# Patient Record
Sex: Male | Born: 1985 | Race: Black or African American | Hispanic: No | Marital: Single | Smoking: Current every day smoker
Health system: Southern US, Community
[De-identification: ages and names within clinical notes are randomized; demographics above are authoritative.]

## PROBLEM LIST (undated history)

## (undated) DIAGNOSIS — I10 Essential (primary) hypertension: Secondary | ICD-10-CM

## (undated) DIAGNOSIS — F319 Bipolar disorder, unspecified: Secondary | ICD-10-CM

## (undated) HISTORY — PX: ABDOMINAL SURGERY: SHX537

## (undated) HISTORY — PX: EYE SURGERY: SHX253

## (undated) HISTORY — PX: APPENDECTOMY: SHX54

---

## 2014-10-06 ENCOUNTER — Emergency Department (HOSPITAL_COMMUNITY)
Admission: EM | Admit: 2014-10-06 | Discharge: 2014-10-06 | Disposition: A | Discharge status: Home / Self Care | Payer: Self-pay | Attending: Emergency Medicine | Admitting: Emergency Medicine

## 2014-10-06 ENCOUNTER — Encounter (HOSPITAL_COMMUNITY): Payer: Self-pay | Admitting: Emergency Medicine

## 2014-10-06 DIAGNOSIS — F32A Depression, unspecified: Secondary | ICD-10-CM

## 2014-10-06 DIAGNOSIS — R45851 Suicidal ideations: Secondary | ICD-10-CM

## 2014-10-06 DIAGNOSIS — I10 Essential (primary) hypertension: Secondary | ICD-10-CM | POA: Insufficient documentation

## 2014-10-06 DIAGNOSIS — Z720 Tobacco use: Secondary | ICD-10-CM | POA: Insufficient documentation

## 2014-10-06 DIAGNOSIS — F1210 Cannabis abuse, uncomplicated: Secondary | ICD-10-CM | POA: Insufficient documentation

## 2014-10-06 DIAGNOSIS — F332 Major depressive disorder, recurrent severe without psychotic features: Secondary | ICD-10-CM

## 2014-10-06 DIAGNOSIS — F329 Major depressive disorder, single episode, unspecified: Secondary | ICD-10-CM | POA: Insufficient documentation

## 2014-10-06 HISTORY — DX: Essential (primary) hypertension: I10

## 2014-10-06 HISTORY — DX: Bipolar disorder, unspecified: F31.9

## 2014-10-06 LAB — SALICYLATE LEVEL

## 2014-10-06 LAB — COMPREHENSIVE METABOLIC PANEL
ALT: 29 U/L (ref 17–63)
AST: 24 U/L (ref 15–41)
Albumin: 4.2 g/dL (ref 3.5–5.0)
Alkaline Phosphatase: 65 U/L (ref 38–126)
Anion gap: 7 (ref 5–15)
BUN: 8 mg/dL (ref 6–20)
CO2: 27 mmol/L (ref 22–32)
Calcium: 9.1 mg/dL (ref 8.9–10.3)
Chloride: 106 mmol/L (ref 101–111)
Creatinine, Ser: 1.15 mg/dL (ref 0.61–1.24)
GFR calc Af Amer: >60 mL/min (ref >60)
GFR calc non Af Amer: >60 mL/min (ref >60)
Glucose, Bld: 89 mg/dL (ref 65–99)
Potassium: 3.7 mmol/L (ref 3.5–5.1)
Sodium: 140 mmol/L (ref 135–145)
Total Bilirubin: 1 mg/dL (ref 0.3–1.2)
Total Protein: 7.4 g/dL (ref 6.5–8.1)

## 2014-10-06 LAB — CBC
HCT: 41.9 % (ref 39.0–52.0)
Hemoglobin: 13.6 g/dL (ref 13.0–17.0)
MCH: 29.8 pg (ref 26.0–34.0)
MCHC: 32.5 g/dL (ref 30.0–36.0)
MCV: 91.9 fL (ref 78.0–100.0)
Platelets: 244 10*3/uL (ref 150–400)
RBC: 4.56 MIL/uL (ref 4.22–5.81)
RDW: 12.9 % (ref 11.5–15.5)
WBC: 6.8 10*3/uL (ref 4.0–10.5)

## 2014-10-06 LAB — ACETAMINOPHEN LEVEL

## 2014-10-06 LAB — RAPID URINE DRUG SCREEN, HOSP PERFORMED
AMPHETAMINES: NOT DETECTED
BARBITURATES: NOT DETECTED
Benzodiazepines: NOT DETECTED
Cocaine: NOT DETECTED
Opiates: NOT DETECTED
Tetrahydrocannabinol: POSITIVE — AB

## 2014-10-06 LAB — ETHANOL: Alcohol, Ethyl (B): <5 mg/dL (ref <5)

## 2014-10-06 MED ORDER — LORAZEPAM 1 MG PO TABS
1.0000 mg | ORAL_TABLET | Freq: Three times a day (TID) | ORAL | Status: DC | PRN
  Filled 2014-10-06: qty 1

## 2014-10-06 MED ORDER — IBUPROFEN 200 MG PO TABS: 600.0000 mg | ORAL_TABLET | Freq: Three times a day (TID) | ORAL | Status: DC | PRN

## 2014-10-06 MED ORDER — NICOTINE 21 MG/24HR TD PT24
21.0000 mg | MEDICATED_PATCH | Freq: Every day | TRANSDERMAL | Status: DC
  Administered 2014-10-06: 21 mg via TRANSDERMAL
  Filled 2014-10-06: qty 1

## 2014-10-06 NOTE — BHH Suicide Risk Assessment (Cosign Needed)
Suicide Risk Assessment  Discharge Assessment   [facility] Discharge Suicide Risk Assessment   Demographic Factors:  Male, Low socioeconomic status and Living alone  Total Time spent with patient: 20 minutes  Musculoskeletal: Strength & Muscle Tone: within normal limits Gait & Station: normal Patient leans: N/A  Psychiatric Specialty Exam:     Blood pressure 124/65, pulse 79, temperature 97.6 F (36.4 C), temperature source Oral, resp. rate 18, SpO2 100 %.There is no height or weight on file to calculate BMI.  General Appearance: Casual and Fairly Groomed  Patent attorney::  Good  Speech:  Clear and Coherent and Normal Rate  Volume:  Normal  Mood:  Anxious  Affect:  Congruent  Thought Process:  Coherent, Goal Directed and Intact  Orientation:  Full (Time, Place, and Person)  Thought Content:  WDL  Suicidal Thoughts:  No  Homicidal Thoughts:  No  Memory:  Immediate;   Good Recent;   Good Remote;   Good  Judgement:  Fair  Insight:  Fair  Psychomotor Activity:  Normal  Concentration:  Fair  Recall:  NA  Fund of Knowledge:Fair  Language: Good  Akathisia:  NA  Handed:  Right  AIMS (if indicated):     Assets:  Desire for Improvement  ADL's:  Intact  Cognition: WNL  Sleep:           Has this patient used any form of tobacco in the last 30 days? (Cigarettes, Smokeless Tobacco, Cigars, and/or Pipes) Yes, A prescription for an FDA-approved tobacco cessation medication was offered at discharge and the patient refused  Mental Status Per Nursing Assessment::   On Admission:     Current Mental Status by Physician: NA  Loss Factors: NA  Historical Factors: NA  Risk Reduction Factors:   Positive coping skills or problem solving skills  Continued Clinical Symptoms:  Bipolar Disorder:   Depressive phase Depression:   Insomnia  Cognitive Features That Contribute To Risk:  Polarized thinking    Suicide Risk:  Minimal: No identifiable suicidal ideation.  Patients  presenting with no risk factors but with morbid ruminations; may be classified as minimal risk based on the severity of the depressive symptoms  Principal Problem: Major depressive disorder, recurrent severe without psychotic features Discharge Diagnoses:  Patient Active Problem List   Diagnosis Date Noted  . Major depressive disorder, recurrent severe without psychotic features [F33.2] 10/06/2014    Priority: High  . Depressed [F32.9]       Plan Of Care/Follow-up recommendations:  Activity:  as tolerated Diet:  regular  Is patient on multiple antipsychotic therapies at discharge:  No   Has Patient had three or more failed trials of antipsychotic monotherapy by history:  No  Recommended Plan for Multiple Antipsychotic Therapies: NA    Boubacar Lerette C    PMHNP-BC 10/06/2014, 11:09 AM

## 2014-10-06 NOTE — ED Notes (Signed)
Pt brought in by EMS for suicidal thoughts  Pt has ETOH on board  Pt has hx of bipolar and depression  Pt has not been taking his medications
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
## 2014-10-06 NOTE — ED Notes (Signed)
Patient to be discharged with outpatient referrals per Dr. Jannifer Franklin and Julieanne Cotton, NP. Prior to Scientist, physiological provided patient with a list of referrals including [facility], [address], [facility] [place], [facility], etc.

## 2014-10-06 NOTE — BHH Counselor (Signed)
This Clinical research associate faxed supporting documentation to the following facilities for patient placement; [facility] [address]  [place], [place] OBS Counselor
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
## 2014-10-06 NOTE — ED Notes (Signed)
Pt's has in belonging bag:  White shirt, white baseball cap, black charger, white tennis shoes, dark blue jean short, black smart phone

## 2014-10-06 NOTE — BH Assessment (Addendum)
Tele Assessment Note   Caleb Griffin is an 29 y.o. male. Who recently moved to [place] and is staying in a hotel, presenting to ED with worsening depression and anxiety. Pt reports he has had SI for three days, thoughts to harm himself with no specific plan. Pt is unable to contract for safety. He sts he came to ED because he is afraid he will harm himself and does not want to do this as he has 4 dtrs. He also reports he has had chest pain that radiates down his right leg and this is making him very anxious. At the time of assessment pt is alert and oriented times 4 with depressed and anxious mood, appropriate affect. Denies HI, AVH, and self harm. Pt has attempted suicide in the past by stabbing himself in the stomach. He reports he had SI six months ago and was hospitalized in [place] but did not act on thoughts.   Pt reports hx of depression for year and sx have been getting worse. He was prescribed Zoloft, but only took it for a month and then stopped. He reports he thinks he as dx with bipolar but denies sx of mania or hypomania. Pt endorses sadness, isolating, loss of pleasure, trouble sleeping, weight loss and irritability. SI without planning.   Pt reports has been very anxious the past couple of days, due to chest pain, and nightmares that something is going to happen to his family. He reports he now worries constantly about how his family is doing. Pt may be experiencing panic attacks but is unsure.   Pt reports using etoh and THC daily. Pt reports he has been inpt "a lot" in [place]. Reports he has no current providers. Family hx negative for MH, SA, and SI concerns per pt.   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 Bipolar 1 disorder   . Hypertension     Past Surgical History   Procedure Laterality Date  . Appendectomy    . Eye surgery    . Abdominal surgery      Family History: History reviewed. No pertinent family history.  Social History:  reports that he has been smoking Cigarettes.  He does not have any smokeless tobacco history on file. He reports that he drinks alcohol. He reports that he uses illicit drugs (Marijuana).  Additional Social History:  Alcohol / Drug Use Pain Medications: See PTA Prescriptions: See PTA, reports he has been off his medications for several months  Over the Counter: See PTA History of alcohol / drug use?: Yes Longest period of sobriety (when/how long): 2-3 years, no hx of seizures reported  Negative Consequences of Use:  (denies) Withdrawal Symptoms:  (denies) Substance #1 Name of Substance 1: etoh  1 - Age of First Use: [age] 1 - Amount (size/oz): 2-3 beers 1 - Frequency: daily  1 - Duration: 2 weeks daily, prior to that about every other day  1 - Last Use / Amount: yesterday  Substance #2 Name of Substance 2: THC 2 - Age of First Use: 14-15 2 - Amount (size/oz): 1-2 blunts 2 - Frequency: every day  2 - Duration: years 2 - Last Use / Amount: a day or so  CIWA: CIWA-Ar BP: 112/78 mmHg Pulse Rate: 77 COWS:    PATIENT STRENGTHS: (choose at  least two) Average or above average intelligence Communication skills  Allergies: No Known Allergies  Home Medications:  (Not in a hospital admission)  OB/GYN Status:  No LMP for male patient.  General Assessment Data [facility] of Assessment: [facility] TTS Assessment: In system Is this a Tele or Face-to-Face Assessment?: Face-to-Face Is this an Initial Assessment or a Re-assessment for this encounter?: Initial Assessment Marital status: Single Is patient pregnant?: No Pregnancy Status: No Living Arrangements: Alone (in hotel, in [place] only a short time) Can pt return to current living arrangement?: Yes Admission Status: Voluntary Is patient capable of signing voluntary  admission?: Yes Referral Source: Self/Family/Friend Insurance type: none     Crisis Care Plan Living Arrangements: Alone (in hotel, in [place] only a short time) Name of Psychiatrist: none Name of Therapist: none  Education Status Is patient currently in school?: No Current Grade: NA Highest grade of school patient has completed: GED Name of school: NA Contact person: NA  Risk to self with the past 6 months Suicidal Ideation: Yes-Currently Present Has patient been a risk to self within the past 6 months prior to admission? : Yes Suicidal Intent: No-Not Currently/Within Last 6 Months Has patient had any suicidal intent within the past 6 months prior to admission? : Yes Is patient at risk for suicide?: Yes Has patient had any suicidal plan within the past 6 months prior to admission? : Yes Access to Means: Yes Specify Access to Suicidal Means: knife What has been your use of drugs/alcohol within the last 12 months?: Pt reports using etoh and THC near daily since teens Previous Attempts/Gestures: Yes How many times?: 1 (7 years ago stabbed himself, SI leading to inpt 6 months ago) Other Self Harm Risks: none Triggers for Past Attempts: Unpredictable Intentional Self Injurious Behavior: None Family Suicide History: No Recent stressful life event(s): Other (Comment) (moved to [place], chest pain ) Persecutory voices/beliefs?: No Depression: Yes Depression Symptoms: Despondent, Insomnia, Tearfulness, Isolating, Loss of interest in usual pleasures, Feeling worthless/self pity, Feeling angry/irritable Substance abuse history and/or treatment for substance abuse?: Yes Suicide prevention information given to non-admitted patients: Not applicable  Risk to Others within the past 6 months Homicidal Ideation: No Does patient have any lifetime risk of violence toward others beyond the six months prior to admission? : No Thoughts of Harm to Others: No Current Homicidal Intent: No Current  Homicidal Plan: No Access to Homicidal Means: No Identified Victim: none History of harm to others?: No Assessment of Violence: None Noted Violent Behavior Description: none Does patient have access to weapons?: No Criminal Charges Pending?: No Does patient have a court date: No Is patient on probation?: No  Psychosis Hallucinations: None noted Delusions: None noted  Mental Status Report Appearance/Hygiene: Unremarkable Eye Contact: Good Motor Activity: Unremarkable Speech: Logical/coherent Level of Consciousness: Alert Mood: Depressed, Anxious Affect: Appropriate to circumstance Anxiety Level: Severe Thought Processes: Coherent, Relevant Judgement: Partial Orientation: Person, Place, Time, Situation Obsessive Compulsive Thoughts/Behaviors: None  Cognitive Functioning Concentration: Normal Memory: Recent Intact, Remote Intact IQ: Average Insight: Fair Impulse Control: Fair Appetite: Poor Weight Loss:  ("At least a couple") Weight Gain: 0 Sleep: Decreased Total Hours of Sleep:  (reports trouble sleeping lately, nightmares) Vegetative Symptoms: Decreased grooming  ADLScreening [facility] Assessment Services) Patient's cognitive ability adequate to safely complete daily activities?: Yes Patient able to express need for assistance with ADLs?: Yes Independently performs ADLs?: Yes (appropriate for developmental age)  Prior Inpatient Therapy Prior Inpatient Therapy: Yes Prior Therapy Dates: "A lot" Prior Therapy  Facilty/Provider(s): in [place] Reason for Treatment: depression, SI   Prior Outpatient Therapy Prior Outpatient Therapy: Yes Prior Therapy Dates:  in the past in [place] Prior Therapy Facilty/Provider(s): unknown Reason for Treatment: depression  Does patient have an ACCT team?: No Does patient have Intensive In-House Services?  : No Does patient have [facility] services? : No Does patient have [facility] services?: No  ADL Screening (condition at time of  admission) Patient's cognitive ability adequate to safely complete daily activities?: Yes Is the patient deaf or have difficulty hearing?: No Does the patient have difficulty seeing, even when wearing glasses/contacts?: No Does the patient have difficulty concentrating, remembering, or making decisions?: No Patient able to express need for assistance with ADLs?: Yes Does the patient have difficulty dressing or bathing?: No Independently performs ADLs?: Yes (appropriate for developmental age) Does the patient have difficulty walking or climbing stairs?: No Weakness of Legs: None Weakness of Arms/Hands: None  Home Assistive Devices/Equipment Home Assistive Devices/Equipment: None    Abuse/Neglect Assessment (Assessment to be complete while patient is alone) Physical Abuse: Denies Verbal Abuse: Denies Sexual Abuse: Denies Exploitation of patient/patient's resources: Denies Values / Beliefs Cultural Requests During Hospitalization: None Spiritual Requests During Hospitalization: None   Advance Directives (For Healthcare) Does patient have an advance directive?: No Would patient like information on creating an advanced directive?: No - patient declined information Nutrition Screen- [facility] Patient's home diet: Regular Has the patient recently lost weight without trying?: Yes, 2-13 lbs. Has the patient been eating poorly because of a decreased appetite?: Yes Malnutrition Screening Tool Score: 2  Additional Information 1:1 In Past 12 Months?: No CIRT Risk: No Elopement Risk: No Does patient have medical clearance?: No     Disposition:  Per Donell Sievert, PA meets inpt criteria for 400 or 300 hall. No current [facility] beds available. TTS to seek placement.  Informed Pt, RN, and Dr. Read Drivers.   Clista Bernhardt, [facility] Triage Specialist 10/06/2014 5:37 AM  Disposition Initial Assessment Completed for this Encounter: Yes  Cerita Rabelo M 10/06/2014 5:18 AM

## 2014-10-06 NOTE — ED Notes (Signed)
Pt appears anxious and depressed. When asked about suicidal thoughts, pt responded "I am feeling decent." Pt did not want to talk bout why he came to the hospital and stated that he was tired of telling his story. Pt c/o mid chest pain and says that the pain happens sometimes when he smokes. Offered ativan and pt refused. Will recheck vitals. Safety maintained on the unit.

## 2014-10-20 ENCOUNTER — Emergency Department (HOSPITAL_COMMUNITY)
Admission: EM | Admit: 2014-10-20 | Discharge: 2014-10-21 | Disposition: A | Discharge status: Home / Self Care | Payer: Self-pay | Attending: Emergency Medicine | Admitting: Emergency Medicine

## 2014-10-20 DIAGNOSIS — R0789 Other chest pain: Secondary | ICD-10-CM | POA: Insufficient documentation

## 2014-10-20 DIAGNOSIS — Z8659 Personal history of other mental and behavioral disorders: Secondary | ICD-10-CM | POA: Insufficient documentation

## 2014-10-20 DIAGNOSIS — I10 Essential (primary) hypertension: Secondary | ICD-10-CM | POA: Insufficient documentation

## 2014-10-20 DIAGNOSIS — R630 Anorexia: Secondary | ICD-10-CM | POA: Insufficient documentation

## 2014-10-20 DIAGNOSIS — Z720 Tobacco use: Secondary | ICD-10-CM | POA: Insufficient documentation

## 2014-10-20 DIAGNOSIS — Z79899 Other long term (current) drug therapy: Secondary | ICD-10-CM | POA: Insufficient documentation

## 2014-10-21 ENCOUNTER — Emergency Department (HOSPITAL_COMMUNITY): Payer: Self-pay

## 2014-10-21 ENCOUNTER — Encounter (HOSPITAL_COMMUNITY): Payer: Self-pay | Admitting: Emergency Medicine

## 2014-10-21 LAB — I-STAT TROPONIN, ED
Troponin i, poc: 0 ng/mL (ref 0.00–0.08)
Troponin i, poc: 0 ng/mL (ref 0.00–0.08)

## 2014-10-21 LAB — BASIC METABOLIC PANEL
ANION GAP: 7 (ref 5–15)
BUN: 14 mg/dL (ref 6–20)
CALCIUM: 9.1 mg/dL (ref 8.9–10.3)
CO2: 26 mmol/L (ref 22–32)
Chloride: 106 mmol/L (ref 101–111)
Creatinine, Ser: 1.26 mg/dL — ABNORMAL HIGH (ref 0.61–1.24)
GFR calc Af Amer: >60 mL/min (ref >60)
Glucose, Bld: 134 mg/dL — ABNORMAL HIGH (ref 65–99)
Potassium: 3.8 mmol/L (ref 3.5–5.1)
Sodium: 139 mmol/L (ref 135–145)

## 2014-10-21 LAB — CBC
HCT: 41 % (ref 39.0–52.0)
Hemoglobin: 13.4 g/dL (ref 13.0–17.0)
MCH: 30.2 pg (ref 26.0–34.0)
MCHC: 32.7 g/dL (ref 30.0–36.0)
MCV: 92.3 fL (ref 78.0–100.0)
Platelets: 255 10*3/uL (ref 150–400)
RBC: 4.44 MIL/uL (ref 4.22–5.81)
RDW: 13.1 % (ref 11.5–15.5)
WBC: 8.4 10*3/uL (ref 4.0–10.5)

## 2014-10-21 LAB — CK: Total CK: 389 U/L (ref 49–397)

## 2014-10-21 LAB — D-DIMER, QUANTITATIVE (NOT AT ARMC)

## 2014-10-21 MED ORDER — CYCLOBENZAPRINE HCL 10 MG PO TABS
5.0000 mg | ORAL_TABLET | Freq: Once | ORAL | Status: AC
  Administered 2014-10-21: 5 mg via ORAL
  Filled 2014-10-21: qty 1

## 2014-10-21 MED ORDER — SODIUM CHLORIDE 0.9 % IV BOLUS (SEPSIS)
1000.0000 mL | Freq: Once | INTRAVENOUS | Status: AC
  Administered 2014-10-21: 1000 mL via INTRAVENOUS

## 2014-10-21 MED ORDER — NAPROXEN 500 MG PO TABS: ORAL_TABLET | ORAL | Status: DC

## 2014-10-21 MED ORDER — METHOCARBAMOL 500 MG PO TABS: ORAL_TABLET | ORAL | Status: DC

## 2014-10-21 MED ORDER — KETOROLAC TROMETHAMINE 30 MG/ML IJ SOLN
30.0000 mg | Freq: Once | INTRAMUSCULAR | Status: AC
  Administered 2014-10-21: 30 mg via INTRAVENOUS
  Filled 2014-10-21: qty 1

## 2014-10-21 NOTE — ED Notes (Signed)
Pt. Made aware for the need of urine. 

## 2014-10-21 NOTE — ED Notes (Signed)
Pt states he started having chest pain about an hour ago  Pt states the pain is in the left chest and is sharp in nature  Pt states he feels dizzy and short of breath  Pt states he had an episode about 2 weeks ago that was the same

## 2014-10-21 NOTE — ED Provider Notes (Signed)
CSN: [id number]   Arrival date & time 10/20/14 2354  History  This chart was scribed for Devoria Albe, MD by Bethel Born, ED Scribe. This patient was seen in room WA16/WA16 and the patient's care was started at 12:58 AM.  Chief Complaint  Patient presents with  . Chest Pain    HPI The history is provided by the patient. No language interpreter was used.   Caleb Griffin is a 29 y.o. male who presents to the Emergency Department complaining of constant left upper chest pain with sudden onset 2 hours ago while smoking a cigarette after work. Initially he had a sharp pain that radiated to the LLE and took his breath. His current pain is dull and rated 8/10 in severity. Movement of the arm exacerbates the pain. He reports several episodes of sharp pain earlier during the day. Recently he has had a poor appetite but notes drinking water. Pt denies fever, cough, and current SOB. He works in [employer] but denies new activity or any known injury. Smokes 2 PPD. Occasional alcohol use. Marijuana+ (last use today). Reports taking his psych meds and is followed at [facility]. He has no PCP and his antihypertensives were prescribed by [facility].   PCP none [facility]  Past Medical History  Diagnosis Date  . Bipolar 1 disorder   . Hypertension     Past Surgical History  Procedure Laterality Date  . Appendectomy    . Eye surgery    . Abdominal surgery      History reviewed. No pertinent family history.  Social History  Substance Use Topics  . Smoking status: Current Every Day Smoker    Types: Cigarettes  . Smokeless tobacco: None  . Alcohol Use: Yes   employed  Review of Systems  Constitutional: Positive for appetite change.  Respiratory: Negative for shortness of breath.   Cardiovascular: Positive for chest pain.  All other systems reviewed and are negative.   Home Medications   Prior to Admission medications   Medication Sig Start Date End Date Taking? Authorizing Provider   amLODipine (NORVASC) 5 MG tablet Take 5 mg by mouth daily.   Yes Historical Provider, MD  hydrochlorothiazide (HYDRODIURIL) 25 MG tablet Take 25 mg by mouth daily.   Yes Historical Provider, MD  methocarbamol (ROBAXIN) 500 MG tablet Take 1 or 2 po Q 6hrs for pain 10/21/14   Devoria Albe, MD  naproxen (NAPROSYN) 500 MG tablet Take 1 po BID with food prn pain 10/21/14   Devoria Albe, MD  bipolar medication  Allergies  Review of patient's allergies indicates no known allergies.  Triage Vitals: BP 110/65 mmHg  Pulse 85  Temp(Src) 98.2 F (36.8 C) (Oral)  Resp 17  SpO2 99%  Vital signs normal    Physical Exam  Constitutional: He is oriented to person, place, and time. He appears well-developed and well-nourished.  Non-toxic appearance. He does not appear ill. No distress.  HENT:  Head: Normocephalic and atraumatic.  Right Ear: External ear normal.  Left Ear: External ear normal.  Nose: Nose normal. No mucosal edema or rhinorrhea.  Mouth/Throat: Oropharynx is clear and moist and mucous membranes are normal. No dental abscesses or uvula swelling.  Eyes: Conjunctivae and EOM are normal. Pupils are equal, round, and reactive to light.  Neck: Normal range of motion and full passive range of motion without pain. Neck supple.  Cardiovascular: Normal rate, regular rhythm and normal heart sounds.  Exam reveals no gallop and no friction rub.   No murmur  heard. Pulmonary/Chest: Effort normal and breath sounds normal. No respiratory distress. He has no wheezes. He has no rhonchi. He has no rales. He exhibits tenderness. He exhibits no crepitus.    Palpation of the left upper chest wall reproduces his pain  Abdominal: Soft. Normal appearance and bowel sounds are normal. He exhibits no distension. There is no tenderness. There is no rebound and no guarding.  Musculoskeletal: Normal range of motion. He exhibits no edema or tenderness.  Moves all extremities well.   Neurological: He is alert and oriented  to person, place, and time. He has normal strength. No cranial nerve deficit.  Skin: Skin is warm, dry and intact. No rash noted. No erythema. No pallor.  Psychiatric: He has a normal mood and affect. His speech is normal and behavior is normal. His mood appears not anxious.  Nursing note and vitals reviewed.   ED Course  Procedures Discharge Medication List as of 10/21/2014  5:29 AM    START taking these medications   Details  methocarbamol (ROBAXIN) 500 MG tablet Take 1 or 2 po Q 6hrs for pain, Print    naproxen (NAPROSYN) 500 MG tablet Take 1 po BID with food prn pain, Print         DIAGNOSTIC STUDIES: Oxygen Saturation is 99% on RA, normal by my interpretation.    COORDINATION OF CARE: 1:05 AM Discussed treatment plan which includes CXR, EKG, labs with pt at bedside and pt agreed to plan.  Patient was rechecked at 3:15 AM. He is sleeping soundly and snoring. He is difficult to awaken. Immediately when he wakes up he states he still hurting and his pain is a "7". A second troponin and d-dimer was added to his blood work.  At time of discharge she is d-dimer is normal so concern for PE is extremely low. His chest was extremely sore to palpation and I still feel like he is having chest wall pain. Patient was discharged with anti-inflammatory and muscle relaxers.  Results for orders placed or performed during the hospital encounter of 10/20/14  Basic metabolic panel  Result Value Ref Range   Sodium 139 135 - 145 mmol/L   Potassium 3.8 3.5 - 5.1 mmol/L   Chloride 106 101 - 111 mmol/L   CO2 26 22 - 32 mmol/L   Glucose, Bld 134 (H) 65 - 99 mg/dL   BUN 14 6 - 20 mg/dL   Creatinine, Ser 1.61 (H) 0.61 - 1.24 mg/dL   Calcium 9.1 8.9 - 09.6 mg/dL   GFR calc non Af Amer >60 >60 mL/min   GFR calc Af Amer >60 >60 mL/min   Anion gap 7 5 - 15  CBC  Result Value Ref Range   WBC 8.4 4.0 - 10.5 K/uL   RBC 4.44 4.22 - 5.81 MIL/uL   Hemoglobin 13.4 13.0 - 17.0 g/dL   HCT 04.5 40.9 -  81.1 %   MCV 92.3 78.0 - 100.0 fL   MCH 30.2 26.0 - 34.0 pg   MCHC 32.7 30.0 - 36.0 g/dL   RDW 91.4 78.2 - 95.6 %   Platelets 255 150 - 400 K/uL  CK  Result Value Ref Range   Total CK 389 49 - 397 U/L  D-dimer, quantitative (not at [facility])  Result Value Ref Range   D-Dimer, Quant <0.27 0.00 - 0.48 ug/mL-FEU  I-stat troponin, ED  Result Value Ref Range   Troponin i, poc 0.00 0.00 - 0.08 ng/mL   Comment 3  I-stat troponin, ED  Result Value Ref Range   Troponin i, poc 0.00 0.00 - 0.08 ng/mL   Comment 3           Laboratory interpretation all normal including delta troponins and normal d-dimer   I, Devoria Albe, MD, personally reviewed and evaluated these images and lab results as part of my medical decision-making.  Imaging Review   Dg Chest 2 View  10/21/2014   CLINICAL DATA:  Upper chest pain for 24 hr, increasing all day. Shortness of breath. Smoker and high blood pressure.  EXAM: CHEST  2 VIEW  COMPARISON:  None.  FINDINGS: The heart size and mediastinal contours are within normal limits. Both lungs are clear. The visualized skeletal structures are unremarkable.  IMPRESSION: No active cardiopulmonary disease.   Electronically Signed   By: Burman Nieves M.D.   On: 10/21/2014 00:48    EKG Interpretation  Date/Time:  Wednesday October 20 2014 23:59:00 EDT Ventricular Rate:  88 PR Interval:  128 QRS Duration: 92 QT Interval:  338 QTC Calculation: 409 R Axis:   34 Text Interpretation:  Sinus rhythm Nonspecific T abnormalities, diffuse leads No old tracing to compare Confirmed by Sabiha Sura  MD-I, Burt Piatek ([id number]) on 10/21/2014 12:07:21 AM       MDM   Final diagnoses:  Chest wall pain    Discharge Medication List as of 10/21/2014  5:29 AM    START taking these medications   Details  methocarbamol (ROBAXIN) 500 MG tablet Take 1 or 2 po Q 6hrs for pain, Print    naproxen (NAPROSYN) 500 MG tablet Take 1 po BID with food prn pain, Print        Plan discharge  Devoria Albe, MD, FACEP    I personally performed the services described in this documentation, which was scribed in my presence. The recorded information has been reviewed and considered.  Devoria Albe, MD, Concha Pyo, MD 10/21/14 [phone]

## 2014-10-21 NOTE — Discharge Instructions (Signed)
Your tests tonight were all normal. You are having chest wall pain. Take the medication as prescribed.  Chest Wall Pain Chest wall pain is pain felt in or around the chest bones and muscles. It may take up to 6 weeks to get better. It may take longer if you are active. Chest wall pain can happen on its own. Other times, things like germs, injury, coughing, or exercise can cause the pain. HOME CARE   Avoid activities that make you tired or cause pain. Try not to use your chest, belly (abdominal), or side muscles. Do not use heavy weights.  Put ice on the sore area.  Put ice in a plastic bag.  Place a towel between your skin and the bag.  Leave the ice on for 15-20 minutes for the first 2 days.  Only take medicine as told by your doctor. GET HELP RIGHT AWAY IF:   You have more pain or are very uncomfortable.  You have a fever.  Your chest pain gets worse.  You have new problems.  You feel sick to your stomach (nauseous) or throw up (vomit).  You start to sweat or feel lightheaded.  You have a cough with mucus (phlegm).  You cough up blood. MAKE SURE YOU:   Understand these instructions.  Will watch your condition.  Will get help right away if you are not doing well or get worse. Document Released: 08/01/2007 Document Revised: 05/07/2011 Document Reviewed: 10/09/2010 [facility] Patient Information 2015 [place], [place]. This information is not intended to replace advice given to you by your health care provider. Make sure you discuss any questions you have with your health care provider.

## 2014-10-22 ENCOUNTER — Emergency Department (HOSPITAL_COMMUNITY)
Admission: EM | Admit: 2014-10-22 | Discharge: 2014-10-22 | Discharge status: Against Medical Advice | Payer: Self-pay | Attending: Emergency Medicine | Admitting: Emergency Medicine

## 2014-10-22 ENCOUNTER — Encounter (HOSPITAL_COMMUNITY): Payer: Self-pay | Admitting: Emergency Medicine

## 2014-10-22 ENCOUNTER — Emergency Department (HOSPITAL_COMMUNITY)
Admission: EM | Admit: 2014-10-22 | Discharge: 2014-10-23 | Disposition: A | Discharge status: Other Acute Inpatient Hospital | Payer: Self-pay | Attending: Emergency Medicine | Admitting: Emergency Medicine

## 2014-10-22 ENCOUNTER — Emergency Department (HOSPITAL_COMMUNITY): Payer: Self-pay

## 2014-10-22 DIAGNOSIS — Z720 Tobacco use: Secondary | ICD-10-CM | POA: Insufficient documentation

## 2014-10-22 DIAGNOSIS — Z79899 Other long term (current) drug therapy: Secondary | ICD-10-CM | POA: Insufficient documentation

## 2014-10-22 DIAGNOSIS — R0789 Other chest pain: Secondary | ICD-10-CM | POA: Insufficient documentation

## 2014-10-22 DIAGNOSIS — I10 Essential (primary) hypertension: Secondary | ICD-10-CM | POA: Insufficient documentation

## 2014-10-22 DIAGNOSIS — R079 Chest pain, unspecified: Secondary | ICD-10-CM | POA: Insufficient documentation

## 2014-10-22 DIAGNOSIS — R45851 Suicidal ideations: Secondary | ICD-10-CM

## 2014-10-22 DIAGNOSIS — F1210 Cannabis abuse, uncomplicated: Secondary | ICD-10-CM | POA: Insufficient documentation

## 2014-10-22 DIAGNOSIS — F332 Major depressive disorder, recurrent severe without psychotic features: Secondary | ICD-10-CM | POA: Diagnosis present

## 2014-10-22 LAB — CBC
HEMATOCRIT: 37.8 % — AB (ref 39.0–52.0)
HEMOGLOBIN: 12 g/dL — AB (ref 13.0–17.0)
MCH: 29.3 pg (ref 26.0–34.0)
MCHC: 31.7 g/dL (ref 30.0–36.0)
MCV: 92.4 fL (ref 78.0–100.0)
Platelets: 241 10*3/uL (ref 150–400)
RBC: 4.09 MIL/uL — ABNORMAL LOW (ref 4.22–5.81)
RDW: 13.3 % (ref 11.5–15.5)
WBC: 8.7 10*3/uL (ref 4.0–10.5)

## 2014-10-22 LAB — I-STAT TROPONIN, ED: Troponin i, poc: 0 ng/mL (ref 0.00–0.08)

## 2014-10-22 NOTE — ED Notes (Signed)
Patient is refusing vital signs.

## 2014-10-22 NOTE — ED Notes (Signed)
Patient was brought in by EMS. Patient was at the parking lot at gas station and the ambulance was called. Patient was acting out when they pulled up and had to give him haldol.

## 2014-10-22 NOTE — ED Notes (Signed)
PT LEFT BUILDING.

## 2014-10-22 NOTE — ED Provider Notes (Signed)
CSN: [id number]     Arrival date & time 10/22/14  2316 History   First MD Initiated Contact with Patient 10/22/14 2322     Chief Complaint  Patient presents with  . Medical Clearance     (Consider location/radiation/quality/duration/timing/severity/associated sxs/prior Treatment) HPI Comments: Patient is a 29 yo M PMHx significant for HTN presenting to the ED via EMS. Patient states he called 911 because he was having continued left sided chest pain after leaving the ER earlier without being seen. No aggravating or alleviating factors. Per EMS patient was acting out, yelling and was given Haldol. Patient is also complaining of suicidal ideations with plan to stab himself with a knife. No HI, hallucinations, EtOH or drug use.    Past Medical History  Diagnosis Date  . Bipolar 1 disorder   . Hypertension    Past Surgical History  Procedure Laterality Date  . Appendectomy    . Eye surgery    . Abdominal surgery     History reviewed. No pertinent family history. Social History  Substance Use Topics  . Smoking status: Current Every Day Smoker    Types: Cigarettes  . Smokeless tobacco: None  . Alcohol Use: Yes    Review of Systems  Cardiovascular: Positive for chest pain.  Psychiatric/Behavioral: Positive for suicidal ideas.  All other systems reviewed and are negative.     Allergies  Review of patient's allergies indicates no known allergies.  Home Medications   Prior to Admission medications   Medication Sig Start Date End Date Taking? Authorizing Provider  amLODipine (NORVASC) 5 MG tablet Take 5 mg by mouth daily.   Yes Historical Provider, MD  hydrochlorothiazide (HYDRODIURIL) 25 MG tablet Take 25 mg by mouth daily.   Yes Historical Provider, MD  methocarbamol (ROBAXIN) 500 MG tablet Take 1 or 2 po Q 6hrs for pain Patient not taking: Reported on 10/22/2014 10/21/14   Rolland Porter, MD  naproxen (NAPROSYN) 500 MG tablet Take 1 po BID with food prn pain Patient not taking:  Reported on 10/22/2014 10/21/14   Rolland Porter, MD   BP 122/58 mmHg  Pulse 102  Temp(Src) 98.3 F (36.8 C) (Oral)  Resp 21  SpO2 95% Physical Exam  Constitutional: He is oriented to person, place, and time. He appears well-developed and well-nourished. He is uncooperative.  HENT:  Head: Normocephalic and atraumatic.  Eyes: Conjunctivae are normal.  Neck: Neck supple.  Cardiovascular: Normal rate, regular rhythm and normal heart sounds.   Pulmonary/Chest: Effort normal and breath sounds normal.  Abdominal: Soft. There is no tenderness.  Neurological: He is alert and oriented to person, place, and time.  Skin: Skin is warm and dry.  Psychiatric: He is not actively hallucinating. He expresses suicidal ideation. He expresses no homicidal ideation. He expresses suicidal plans. He expresses no homicidal plans.  Nursing note and vitals reviewed.   ED Course  Procedures (including critical care time) Medications  amLODipine (NORVASC) tablet 5 mg (not administered)  hydrochlorothiazide (HYDRODIURIL) tablet 25 mg (not administered)  ibuprofen (ADVIL,MOTRIN) tablet 600 mg (not administered)  alum & mag hydroxide-simeth (MAALOX/MYLANTA) 200-200-20 MG/5ML suspension 30 mL (not administered)  nicotine (NICODERM CQ - dosed in mg/24 hours) patch 21 mg (not administered)  LORazepam (ATIVAN) tablet 1 mg (not administered)  acetaminophen (TYLENOL) tablet 650 mg (not administered)    Labs Review Labs Reviewed  URINE RAPID DRUG SCREEN, HOSP PERFORMED  ETHANOL  SALICYLATE LEVEL  ACETAMINOPHEN LEVEL  I-STAT TROPOININ, ED    Imaging Review Dg Chest  2 View  10/22/2014   CLINICAL DATA:  Hervey Ard LEFT-sided chest pain beginning 2 days ago, history smoking and hypertension  EXAM: CHEST  2 VIEW  COMPARISON:  10/21/2014  FINDINGS: Normal heart size, mediastinal contours, and pulmonary vascularity.  Lungs clear.  No pneumothorax.  Bones unremarkable.  IMPRESSION: Normal exam.   Electronically Signed   By:  Lavonia Dana M.D.   On: 10/22/2014 23:54   I have personally reviewed and evaluated these images and lab results as part of my medical decision-making.   EKG Interpretation None       Results for MARCAS, BOWSHER (MRN [id number]) as of 10/23/2014 00:59  Ref. Range 10/22/2014 23:30  Sodium Latest Ref Range: 135-145 mmol/L 139  Potassium Latest Ref Range: 3.5-5.1 mmol/L 4.0  Chloride Latest Ref Range: 101-111 mmol/L 108  CO2 Latest Ref Range: 22-32 mmol/L 26  BUN Latest Ref Range: 6-20 mg/dL 12  Creatinine Latest Ref Range: 0.61-1.24 mg/dL 1.05  Calcium Latest Ref Range: 8.9-10.3 mg/dL 8.6 (L)  EGFR (Non-African Amer.) Latest Ref Range: >60 mL/min >60  EGFR (African American) Latest Ref Range: >60 mL/min >60  Glucose Latest Ref Range: 65-99 mg/dL 101 (H)  Anion gap Latest Ref Range: 5-15  5  WBC Latest Ref Range: 4.0-10.5 K/uL 8.7  RBC Latest Ref Range: 4.22-5.81 MIL/uL 4.09 (L)  Hemoglobin Latest Ref Range: 13.0-17.0 g/dL 12.0 (L)  HCT Latest Ref Range: 39.0-52.0 % 37.8 (L)  MCV Latest Ref Range: 78.0-100.0 fL 92.4  MCH Latest Ref Range: 26.0-34.0 pg 29.3  MCHC Latest Ref Range: 30.0-36.0 g/dL 31.7  RDW Latest Ref Range: 11.5-15.5 % 13.3  Platelets Latest Ref Range: 150-400 K/uL 241   MDM   Final diagnoses:  Suicidal ideation  Chest pain, atypical   Filed Vitals:   10/22/14 2333  BP: 122/58  Pulse: 102  Temp: 98.3 F (36.8 C)  Resp: 21   Afebrile, NAD, non-toxic appearing, AAOx4.   Pt presents to the ED for medical clearance.  Pt is currently having SI ideations. Pt has a plan.   The patient is uncooperative during examination, very unwilling to answer questions.   The patient is complaining of continued chest pain, seen before for similar CP in the past. Troponin negative, EKG unremarkable, CXR unremarkable. Heart Score 1. Low suspicion for ACS. No hypoxia, persistent tachycardia or tachypnea to suggest PE.   TTS consult was appreciated and pt was moved to Psych ED for  further evaluation.        Baron Sane, PA-C 10/23/14 0113  Linton Flemings, MD 10/23/14 2108

## 2014-10-22 NOTE — ED Notes (Signed)
Attempted to obtain EKG in room in lobby pt refused, pt also refused for the RN to triage him there. Pt demanding water, told pt he needed to wait to see the EDP. Pt stated he would get his own.

## 2014-10-22 NOTE — ED Notes (Signed)
EKG given to EDP,Pollina,MD., for review. 
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
## 2014-10-22 NOTE — ED Notes (Signed)
Bed: WA04 Expected date:  Expected time:  Means of arrival:  Comments: 22M flight of ideas, haldol given

## 2014-10-22 NOTE — ED Notes (Signed)
Called to [facility] for patient behaving aggressively, cursing loudly. [employer] called to address patient, pt then stood from chair speaking to me through the glass stating " naw, tell them fuckers to stand down and you can suck my dick" pt cursed several more times and was seen leaving the premises. [employer] and security aware.

## 2014-10-23 ENCOUNTER — Inpatient Hospital Stay (HOSPITAL_COMMUNITY)
Admission: AD | Admit: 2014-10-23 | Discharge: 2014-10-25 | DRG: 885 | Disposition: A | Discharge status: Home / Self Care | Payer: Federal, State, Local not specified - Other | Source: Intra-hospital | Attending: Psychiatry | Admitting: Psychiatry

## 2014-10-23 ENCOUNTER — Encounter (HOSPITAL_COMMUNITY): Payer: Self-pay

## 2014-10-23 DIAGNOSIS — F332 Major depressive disorder, recurrent severe without psychotic features: Secondary | ICD-10-CM | POA: Diagnosis not present

## 2014-10-23 DIAGNOSIS — F1994 Other psychoactive substance use, unspecified with psychoactive substance-induced mood disorder: Secondary | ICD-10-CM | POA: Diagnosis not present

## 2014-10-23 DIAGNOSIS — G4700 Insomnia, unspecified: Secondary | ICD-10-CM | POA: Diagnosis present

## 2014-10-23 DIAGNOSIS — F17210 Nicotine dependence, cigarettes, uncomplicated: Secondary | ICD-10-CM | POA: Diagnosis present

## 2014-10-23 DIAGNOSIS — R45851 Suicidal ideations: Secondary | ICD-10-CM | POA: Insufficient documentation

## 2014-10-23 DIAGNOSIS — I10 Essential (primary) hypertension: Secondary | ICD-10-CM | POA: Diagnosis present

## 2014-10-23 LAB — ETHANOL

## 2014-10-23 LAB — BASIC METABOLIC PANEL
Anion gap: 5 (ref 5–15)
BUN: 12 mg/dL (ref 6–20)
CALCIUM: 8.6 mg/dL — AB (ref 8.9–10.3)
CO2: 26 mmol/L (ref 22–32)
CREATININE: 1.05 mg/dL (ref 0.61–1.24)
Chloride: 108 mmol/L (ref 101–111)
GFR calc Af Amer: >60 mL/min (ref >60)
GFR calc non Af Amer: >60 mL/min (ref >60)
Glucose, Bld: 101 mg/dL — ABNORMAL HIGH (ref 65–99)
Potassium: 4 mmol/L (ref 3.5–5.1)
Sodium: 139 mmol/L (ref 135–145)

## 2014-10-23 LAB — RAPID URINE DRUG SCREEN, HOSP PERFORMED
Amphetamines: NOT DETECTED
BARBITURATES: NOT DETECTED
Benzodiazepines: NOT DETECTED
COCAINE: NOT DETECTED
Opiates: NOT DETECTED
Tetrahydrocannabinol: POSITIVE — AB

## 2014-10-23 LAB — ACETAMINOPHEN LEVEL

## 2014-10-23 LAB — SALICYLATE LEVEL: Salicylate Lvl: <4 mg/dL (ref 2.8–30.0)

## 2014-10-23 MED ORDER — AMLODIPINE BESYLATE 5 MG PO TABS
5.0000 mg | ORAL_TABLET | Freq: Every day | ORAL | Status: DC
  Administered 2014-10-23: 5 mg via ORAL
  Filled 2014-10-23: qty 1

## 2014-10-23 MED ORDER — NICOTINE 21 MG/24HR TD PT24: 21.0000 mg | MEDICATED_PATCH | Freq: Every day | TRANSDERMAL | Status: DC

## 2014-10-23 MED ORDER — FLUOXETINE HCL 20 MG PO CAPS
20.0000 mg | ORAL_CAPSULE | Freq: Every day | ORAL | Status: DC
  Administered 2014-10-23: 20 mg via ORAL
  Filled 2014-10-23: qty 1

## 2014-10-23 MED ORDER — LORAZEPAM 1 MG PO TABS
1.0000 mg | ORAL_TABLET | Freq: Three times a day (TID) | ORAL | Status: DC | PRN
  Administered 2014-10-23: 1 mg via ORAL
  Filled 2014-10-23: qty 1

## 2014-10-23 MED ORDER — MAGNESIUM HYDROXIDE 400 MG/5ML PO SUSP: 30.0000 mL | Freq: Every day | ORAL | Status: DC | PRN

## 2014-10-23 MED ORDER — ACETAMINOPHEN 325 MG PO TABS: 650.0000 mg | ORAL_TABLET | ORAL | Status: DC | PRN

## 2014-10-23 MED ORDER — NICOTINE 21 MG/24HR TD PT24
21.0000 mg | MEDICATED_PATCH | Freq: Every day | TRANSDERMAL | Status: DC
  Administered 2014-10-24: 21 mg via TRANSDERMAL
  Filled 2014-10-23 (×3): qty 1

## 2014-10-23 MED ORDER — ALUM & MAG HYDROXIDE-SIMETH 200-200-20 MG/5ML PO SUSP: 30.0000 mL | ORAL | Status: DC | PRN

## 2014-10-23 MED ORDER — FLUOXETINE HCL 20 MG PO CAPS
20.0000 mg | ORAL_CAPSULE | Freq: Every day | ORAL | Status: DC
Start: 2014-10-24 — End: 2014-10-25
  Administered 2014-10-24: 20 mg via ORAL
  Filled 2014-10-23 (×3): qty 1

## 2014-10-23 MED ORDER — AMLODIPINE BESYLATE 5 MG PO TABS
5.0000 mg | ORAL_TABLET | Freq: Every day | ORAL | Status: DC
Start: 2014-10-24 — End: 2014-10-25
  Administered 2014-10-24: 5 mg via ORAL
  Filled 2014-10-23 (×3): qty 1

## 2014-10-23 MED ORDER — HYDROCHLOROTHIAZIDE 25 MG PO TABS
25.0000 mg | ORAL_TABLET | Freq: Every day | ORAL | Status: DC
  Administered 2014-10-23: 25 mg via ORAL
  Filled 2014-10-23: qty 1

## 2014-10-23 MED ORDER — ACETAMINOPHEN 325 MG PO TABS: 650.0000 mg | ORAL_TABLET | Freq: Four times a day (QID) | ORAL | Status: DC | PRN

## 2014-10-23 MED ORDER — IBUPROFEN 200 MG PO TABS: 600.0000 mg | ORAL_TABLET | Freq: Three times a day (TID) | ORAL | Status: DC | PRN

## 2014-10-23 MED ORDER — IBUPROFEN 600 MG PO TABS: 600.0000 mg | ORAL_TABLET | Freq: Three times a day (TID) | ORAL | Status: DC | PRN

## 2014-10-23 MED ORDER — HYDROCHLOROTHIAZIDE 25 MG PO TABS
25.0000 mg | ORAL_TABLET | Freq: Every day | ORAL | Status: DC
  Administered 2014-10-24: 25 mg via ORAL
  Filled 2014-10-23 (×3): qty 1

## 2014-10-23 MED ORDER — LORAZEPAM 1 MG PO TABS: 1.0000 mg | ORAL_TABLET | Freq: Three times a day (TID) | ORAL | Status: DC | PRN

## 2014-10-23 MED ORDER — TRAZODONE HCL 100 MG PO TABS
100.0000 mg | ORAL_TABLET | Freq: Every evening | ORAL | Status: DC | PRN
  Administered 2014-10-23 – 2014-10-24 (×2): 100 mg via ORAL
  Filled 2014-10-23 (×2): qty 1

## 2014-10-23 NOTE — BH Assessment (Signed)
Consulted with Doran Stabler , PA regarding patient assessment and disposition, who said that pt. meets criteria for inpatient psychiatric treatment. TTS will contact facilities for placement. Notified Durwin Glaze RN, [facility] of recommendation.

## 2014-10-23 NOTE — ED Notes (Addendum)
Pt.in burgundy scrubs.pt. wanded and searched by security. Pt. Has 1 belongings bag. Pt. Has white/blue tennis shoes, bluejean shorts, 1 black shirt, 1 black sun glassed, 1 black belt, 1 hat, no cell phone, wallet(money( $30.00) (counted money with RN,Janeen and NT,Elyzabeth Goatley) and ID card and SS card locked up by security(packet number [id number]). Pt. Belongings locked up in the [employer] [address].

## 2014-10-23 NOTE — ED Notes (Signed)
[facility] AC request patient to transfer at 2000. Will report to oncoming shift.
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
## 2014-10-23 NOTE — ED Notes (Signed)
Key for box taped on inventory paper and placed in patients belongings bag.
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
## 2014-10-23 NOTE — Tx Team (Signed)
Initial Interdisciplinary Treatment Plan   PATIENT STRESSORS: Substance abuse   PATIENT STRENGTHS: Ability for insight Supportive family/friends   PROBLEM LIST: Problem List/Patient Goals Date to be addressed Date deferred Reason deferred Estimated date of resolution  Depression 10/23/2014     Anxiety       SI            Goal- Pt reports he wants " to try and get his thoughts back to normal and stop feeling paranoid."                               DISCHARGE CRITERIA:  Improved stabilization in mood, thinking, and/or behavior Need for constant or close observation no longer present  PRELIMINARY DISCHARGE PLAN: Return to previous living arrangement  PATIENT/FAMIILY INVOLVEMENT: This treatment plan has been presented to and reviewed with the patient, Rehman Levinson, and/or family member.  The patient and family have been given the opportunity to ask questions and make suggestions.  Floyce Stakes 10/23/2014, 10:40 PM
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
## 2014-10-23 NOTE — BH Assessment (Signed)
Ellis Parents, RN [facility], and ordered cart for tele-psych at 2:38 am.

## 2014-10-23 NOTE — Progress Notes (Signed)
D Pt is alert and oriented, denies SI and HI, at present time, no complaints of pain or discomfort noted on transfer to [facility]. Pt. Does report some anxiety.  A Writer offered support and encouragement,  R Pt. Remains safe on the unit.  Pt. Was offered food and fluids upon arrival and an ativan to reduce his anxiety.

## 2014-10-23 NOTE — ED Notes (Signed)
Bed: ZOX09 Expected date:  Expected time:  Means of arrival:  Comments: Room 4

## 2014-10-23 NOTE — Progress Notes (Signed)
D Pt. Denies SI and HI, no complaints of pain or discomfort noted at present time.  A Writer offered support and encouragement, discussed transfer plans with pt.  R Pt.  Remains safe on the unit.

## 2014-10-23 NOTE — BHH Counselor (Signed)
Patient has been accepted to [facility] 404-2 per Berneice Heinrich, RN, [facility]. Patient has signed voluntary consent to treat paperwork and release of information. Paperwork has been faxed to [facility]. Patient expressed no concerns or questions at this time.    Davina Poke, LCSW Therapeutic Triage Specialist [facility] 10/23/2014 4:36 PM

## 2014-10-24 ENCOUNTER — Encounter (HOSPITAL_COMMUNITY): Payer: Self-pay | Admitting: Psychiatry

## 2014-10-24 DIAGNOSIS — F1994 Other psychoactive substance use, unspecified with psychoactive substance-induced mood disorder: Secondary | ICD-10-CM

## 2014-10-24 DIAGNOSIS — F332 Major depressive disorder, recurrent severe without psychotic features: Principal | ICD-10-CM

## 2014-10-24 MED ORDER — TRAZODONE HCL 100 MG PO TABS: 100.0000 mg | ORAL_TABLET | Freq: Every evening | ORAL | Status: AC | PRN

## 2014-10-24 MED ORDER — FLUOXETINE HCL 20 MG PO CAPS: 20.0000 mg | ORAL_CAPSULE | Freq: Every day | ORAL | Status: AC

## 2014-10-24 MED ORDER — AMLODIPINE BESYLATE 5 MG PO TABS: 5.0000 mg | ORAL_TABLET | Freq: Every day | ORAL | Status: AC

## 2014-10-24 MED ORDER — HYDROCHLOROTHIAZIDE 25 MG PO TABS: 25.0000 mg | ORAL_TABLET | Freq: Every day | ORAL | Status: AC

## 2014-10-24 MED ORDER — NICOTINE 21 MG/24HR TD PT24: 21.0000 mg | MEDICATED_PATCH | Freq: Every day | TRANSDERMAL | Status: DC

## 2014-10-24 NOTE — BHH Group Notes (Signed)
BHH Group Notes: (Clinical Social Work)   10/24/2014      Type of Therapy:  Group Therapy   Participation Level:  Did Not Attend despite MHT prompting   Ambrose Mantle, LCSW 10/24/2014, 3:53 PM

## 2014-10-24 NOTE — Progress Notes (Signed)
Psychoeducational Group Note  Date:  10/24/2014 Time:  1015  Group Topic/Focus:  Making Healthy Choices:   The focus of this group is to help patients identify negative/unhealthy choices they were using prior to admission and identify positive/healthier coping strategies to replace them upon discharge.  Participation Level:  Active  Participation Quality:  Appropriate  Affect:  Appropriate  Cognitive:  Appropriate  Insight:  Improving  Engagement in Group:  Engaged  Additional Comments:    Trayden Brandy A 10/24/2014 
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
## 2014-10-24 NOTE — Progress Notes (Signed)
Caleb Griffin has adjusted well to the hospital today. He  Completed his daily assessment and on it he wrote he denied SI and he rated his depression, hopelessness and anxiety " 2/0/0/", respectively.   A He attended his LIfe Skills group and stated he felt like it helped him.   R Per MD,, her will be dc'Caleb home Mon AM.

## 2014-10-24 NOTE — Progress Notes (Signed)
Psychoeducational Group Note  Date: 10/24/2014 Time:  0930 Group Topic/Focus:  Gratefulness:  The focus of this group is to help patients identify what two things they are most grateful for in their lives. What helps ground them and to center them on their work to their recovery.  Participation Level:  Active  Participation Quality:  Appropriate  Affect:  Appropriate  Cognitive:  Oriented  Insight:  Improving  Engagement in Group:  Engaged  Additional Comments:    Richy Spradley A  
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
## 2014-10-24 NOTE — [facility] Suicide Risk Assessment (Signed)
[facility] Discharge Suicide Risk Assessment   Demographic Factors:  Male  Total Time spent with patient: 30 minutes  Musculoskeletal: Strength & Muscle Tone: within normal limits Gait & Station: normal Patient leans: normal  Psychiatric Specialty Exam: Physical Exam  Review of Systems  Constitutional: Negative.   HENT: Negative.   Eyes: Negative.   Respiratory: Negative.   Cardiovascular: Negative.   Gastrointestinal: Negative.   Genitourinary: Negative.   Musculoskeletal: Negative.   Skin: Negative.   Neurological: Negative.   Endo/Heme/Allergies: Negative.   Psychiatric/Behavioral: Positive for substance abuse.    Blood pressure 124/77, pulse 79, temperature 97.8 F (36.6 C), temperature source Oral, resp. rate 18, height 6' (1.829 m), weight 99.791 kg (220 lb).Body mass index is 29.83 kg/(m^2).  General Appearance: Fairly Groomed  Patent attorney::  Fair  Speech:  Clear and Coherent409  Volume:  Normal  Mood:  Euthymic  Affect:  Appropriate  Thought Process:  Coherent and Goal Directed  Orientation:  Full (Time, Place, and Person)  Thought Content:  plans as he moves on, relapse prevention plan  Suicidal Thoughts:  No  Homicidal Thoughts:  No  Memory:  Immediate;   Fair Recent;   Fair Remote;   Fair  Judgement:  Fair  Insight:  Present  Psychomotor Activity:  Normal  Concentration:  Fair  Recall:  [employer] of Knowledge:Fair  Language: Fair  Akathisia:  No  Handed:  Right  AIMS (if indicated):     Assets:  Desire for Improvement Vocational/Educational  Sleep:  Number of Hours: 6.75  Cognition: WNL  ADL's:  Intact   Have you used any form of tobacco in the last 30 days? (Cigarettes, Smokeless Tobacco, Cigars, and/or Pipes): Yes  Has this patient used any form of tobacco in the last 30 days? (Cigarettes, Smokeless Tobacco, Cigars, and/or Pipes) A prescription for Nicotine patches will be written  Mental Status Per Nursing Assessment::   On Admission:   Self-harm thoughts  Current Mental Status by Physician: In full contact with reality. There are no active SI plans or intent. There are no active S/S of withdrawal. He states he came to the ED with pain in his side and then feeling suicidal . States he does not have any of those symptoms anymore. He wants to go back to work by 6 AM so he does not risk losing his job. [facility] will [facility]   Loss Factors: NA  Historical Factors: NA  Risk Reduction Factors:   Responsible for children under 10 years of age, Sense of responsibility to family, Employed and Positive social support  Continued Clinical Symptoms:  Depression:   Comorbid alcohol abuse/dependence  Cognitive Features That Contribute To Risk:  Closed-mindedness, Polarized thinking and Thought constriction (tunnel vision)    Suicide Risk:  Minimal: No identifiable suicidal ideation.  Patients presenting with no risk factors but with morbid ruminations; may be classified as minimal risk based on the severity of the depressive symptoms  Principal Problem: MDD (major depressive disorder), recurrent episode, severe Discharge Diagnoses:  Patient Active Problem List   Diagnosis Date Noted  . MDD (major depressive disorder), recurrent episode, severe [F33.2] 10/23/2014  . Substance induced mood disorder [F19.94] 10/23/2014  . Suicidal ideation [R45.851]   . Major depressive disorder, recurrent severe without psychotic features [F33.2] 10/06/2014      Plan Of Care/Follow-up recommendations:  Activity:  as tolerated Diet:  regular Follow up [facility] Is patient on multiple antipsychotic therapies at discharge:  No  Has Patient had three or more failed trials of antipsychotic monotherapy by history:  No  Recommended Plan for Multiple Antipsychotic Therapies: NA    Kymani Shimabukuro A 10/24/2014, 7:29 PM

## 2014-10-25 MED ORDER — NICOTINE 21 MG/24HR TD PT24: 21.0000 mg | MEDICATED_PATCH | Freq: Every day | TRANSDERMAL | Status: AC
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
## 2014-10-25 NOTE — Progress Notes (Signed)
Pt discharged at this time, pt denies SI/HI. Pt provided discharge instructions and pt verbalized understanding. Pt provided with bus pass and prescriptions. Pt left ambulatory and with all belongings.

## 2014-10-26 NOTE — Progress Notes (Addendum)
  [facility] Case [facility] Discharge Plan :  Will you be returning to the same living situation after discharge:  Yes,  Patient plans to return to previous living situation At discharge, do you have transportation home?: Yes,  patient reports access to transportation Do you have the ability to pay for your medications: Yes,  patient provided with necessary prescriptions  Release of information consent forms completed and in the chart;  Patient's signature needed at discharge.  Declines Follow up.  Patient denies SI/HI: Yes,  denies    Aeronautical engineer and Suicide Prevention discussed: Yes,  with patient  Have you used any form of tobacco in the last 30 days? (Cigarettes, Smokeless Tobacco, Cigars, and/or Pipes): Yes  Has patient been referred to the Quitline?: Patient refused referral  Herminia Warren, [place] 10/26/2014, 9:08 AM

## 2014-10-26 NOTE — [facility] Counselor (Signed)
No PSA completed due to patient discharging prior to 72 hours.  Louvenia Golomb, MSW, LCSWA Clinical Social Worker [facility] [phone]  

## 2014-10-26 NOTE — [facility] Suicide Risk Assessment (Signed)
[facility] INPATIENT:  Family/Significant Other Suicide Prevention Education  Suicide Prevention Education:  Patient Refusal for Family/Significant Other Suicide Prevention Education: The patient Caleb Griffin has refused to provide written consent for family/significant other to be provided Family/Significant Other Suicide Prevention Education during admission and/or prior to discharge.  Physician notified.  Quandre Polinski, [place] 10/26/2014, 9:05 AM

## 2015-09-08 ENCOUNTER — Encounter (HOSPITAL_COMMUNITY): Payer: Self-pay | Admitting: *Deleted

## 2015-09-08 DIAGNOSIS — F17210 Nicotine dependence, cigarettes, uncomplicated: Secondary | ICD-10-CM | POA: Insufficient documentation

## 2015-09-08 DIAGNOSIS — Z79899 Other long term (current) drug therapy: Secondary | ICD-10-CM | POA: Insufficient documentation

## 2015-09-08 DIAGNOSIS — F4321 Adjustment disorder with depressed mood: Secondary | ICD-10-CM | POA: Insufficient documentation

## 2015-09-08 DIAGNOSIS — I10 Essential (primary) hypertension: Secondary | ICD-10-CM | POA: Insufficient documentation

## 2015-09-08 NOTE — ED Notes (Signed)
Notified staffing of need for sitter 

## 2015-09-08 NOTE — ED Notes (Signed)
Pt states that he was at work and was told that he was getting laid off. States he feels depressed and has suicidal thoughts. Denies plan.

## 2015-09-08 NOTE — ED Notes (Signed)
Pt aggressive towards phlebotomy. Unable to obtain labs at this time.

## 2015-09-09 ENCOUNTER — Emergency Department (HOSPITAL_COMMUNITY)
Admission: EM | Admit: 2015-09-09 | Discharge: 2015-09-09 | Disposition: A | Discharge status: Home / Self Care | Payer: Self-pay | Attending: Emergency Medicine | Admitting: Emergency Medicine

## 2015-09-09 DIAGNOSIS — F4321 Adjustment disorder with depressed mood: Secondary | ICD-10-CM

## 2015-09-09 LAB — RAPID URINE DRUG SCREEN, HOSP PERFORMED
AMPHETAMINES: NOT DETECTED
BARBITURATES: NOT DETECTED
BENZODIAZEPINES: NOT DETECTED
Cocaine: POSITIVE — AB
Opiates: NOT DETECTED
TETRAHYDROCANNABINOL: POSITIVE — AB

## 2015-09-09 LAB — CBC
HEMATOCRIT: 41.5 % (ref 39.0–52.0)
HEMOGLOBIN: 13.4 g/dL (ref 13.0–17.0)
MCH: 29.7 pg (ref 26.0–34.0)
MCHC: 32.3 g/dL (ref 30.0–36.0)
MCV: 92 fL (ref 78.0–100.0)
PLATELETS: 238 10*3/uL (ref 150–400)
RBC: 4.51 MIL/uL (ref 4.22–5.81)
RDW: 12.8 % (ref 11.5–15.5)
WBC: 8.7 10*3/uL (ref 4.0–10.5)

## 2015-09-09 LAB — ETHANOL

## 2015-09-09 LAB — COMPREHENSIVE METABOLIC PANEL
ALBUMIN: 4 g/dL (ref 3.5–5.0)
ALT: 18 U/L (ref 17–63)
AST: 22 U/L (ref 15–41)
Alkaline Phosphatase: 68 U/L (ref 38–126)
Anion gap: 9 (ref 5–15)
BUN: 10 mg/dL (ref 6–20)
CHLORIDE: 104 mmol/L (ref 101–111)
CO2: 26 mmol/L (ref 22–32)
CREATININE: 1.25 mg/dL — AB (ref 0.61–1.24)
Calcium: 8.8 mg/dL — ABNORMAL LOW (ref 8.9–10.3)
GFR calc Af Amer: >60 mL/min (ref >60)
GLUCOSE: 126 mg/dL — AB (ref 65–99)
POTASSIUM: 4 mmol/L (ref 3.5–5.1)
Sodium: 139 mmol/L (ref 135–145)
Total Bilirubin: 0.6 mg/dL (ref 0.3–1.2)
Total Protein: 7 g/dL (ref 6.5–8.1)

## 2015-09-09 LAB — SALICYLATE LEVEL: Salicylate Lvl: <4 mg/dL (ref 2.8–30.0)

## 2015-09-09 LAB — ACETAMINOPHEN LEVEL: Acetaminophen (Tylenol), Serum: <10 ug/mL — ABNORMAL LOW (ref 10–30)

## 2015-09-09 NOTE — ED Notes (Signed)
Breakfast mealtray at bedside.

## 2015-09-09 NOTE — ED Notes (Signed)
Called [facility] advised patient still has not been Assessed advised will see assess shortly.

## 2015-09-09 NOTE — ED Notes (Signed)
Pt says he was previously prescribed Zoloft. Has been off medication for almost 10 months.

## 2015-09-09 NOTE — ED Notes (Signed)
PA at bedside.

## 2015-09-09 NOTE — Discharge Instructions (Signed)
  Adjustment Disorder Adjustment disorder is an unusually severe reaction to a stressful life event, such as the loss of a job or physical illness. The event may be any stressful event other than the loss of a loved one. Adjustment disorder may affect your feelings, your thinking, how you act, or a combination of these. It may interfere with personal relationships or with the way you are at work, school, or home. People with this disorder are at risk for suicide and substance abuse. They may develop a more serious mental disorder, such as major depressive disorder or post-traumatic stress disorder. SIGNS AND SYMPTOMS  Symptoms may include:  Sadness, depressed mood, or crying spells.  Loss of enjoyment.  Change in appetite or weight.  Sense of loss or hopelessness.  Thoughts of suicide.  Anxiety, worry, or nervousness.  Trouble sleeping.  Avoiding family and friends.  Poor school performance.  Fighting or vandalism.  Reckless driving.  Skipping school.  Poor work performance.  Ignoring bills. Symptoms of adjustment disorder start within 3 months of the stressful life event. They do not last more than 6 months after the event has ended. DIAGNOSIS  To make a diagnosis, your health care provider will ask about what has happened in your life and how it has affected you. He or she may also ask about your medical history and use of medicines, alcohol, and other substances. Your health care provider may do a physical exam and order lab tests or other studies. You may be referred to a mental health specialist for evaluation. TREATMENT  Treatment options include:  Counseling or talk therapy. Talk therapy is usually provided by mental health specialists.  Medicine. Certain medicines may help with depression, anxiety, and sleep.  Support groups. Support groups offer emotional support, advice, and guidance. They are made up of people who have had similar experiences. HOME CARE  INSTRUCTIONS  Keep all follow-up visits as directed by your health care provider. This is important.  Take medicines only as directed by your health care provider. SEEK MEDICAL CARE IF:  Your symptoms get worse.  SEEK IMMEDIATE MEDICAL CARE IF: You have serious thoughts about hurting yourself or someone else. MAKE SURE YOU:  Understand these instructions.  Will watch your condition.  Will get help right away if you are not doing well or get worse.   This information is not intended to replace advice given to you by your health care provider. Make sure you discuss any questions you have with your health care provider.   Document Released: 10/17/2005 Document Revised: 03/05/2014 Document Reviewed: 07/07/2013 Elsevier Interactive Patient Education 2016 [employer]  

## 2015-09-09 NOTE — ED Notes (Signed)
Patient eating lunch.

## 2015-09-09 NOTE — ED Notes (Signed)
Pt threatening to leave ED. Dr Blinda LeatherwoodPollina aware. IVC papers initiated.

## 2015-09-09 NOTE — BH Assessment (Signed)
Tele Assessment Note   Caleb Griffin is an 30 y.o. male. [facility] but then was placed under IVC early this am d/t worry that pt might elope. Pt is pleasant and oriented x 4. He denies SI and denies plan. Pt denies HI and denies [facility]. No delusions noted. 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 Pt reports he was working 55 hrs weekly. He reports his mood has been good until yesterday when he was laid off. Pt is future oriented. He asks Clinical research associatewriter for "help finding another job." Pt denies access to weapons and he has no upcoming court dates. Per chart review, [facility] for SI and depression. He reports one suicide attempt by stabbing himself approx 9 yrs ago. Pt reports he lives alone and has 4 daughters. He reports he smokes marijuana twice a month and has used cocaine twice ever. He says he last used cocaine & marijuana 09/07/15.   Diagnosis: Unspecified Depressive Disorder  Past Medical History:  Past Medical History  Diagnosis Date  . Bipolar 1 disorder (HCC)   . Hypertension     Past Surgical History  Procedure Laterality Date  . Appendectomy    . Eye surgery    . Abdominal surgery      Family History: No family history on file.  Social History:  reports that he has been smoking Cigarettes.  He has been smoking about 2.00 packs per day. He does not have any smokeless tobacco history on file. He reports that he drinks alcohol. He reports that he uses illicit drugs (Marijuana).  Additional Social History:  Alcohol / Drug Use Pain Medications: pt denies abuse - see pta meds list Prescriptions: pt denies abuse - see pta meds list Over the Counter: pt denies abuse - see pta meds list History of alcohol / drug use?: Yes Substance #1 Name of Substance 1: marijuana 1 - Age of First Use: 12 1 - Amount (size/oz): 1 to 2 blunts 1 - Frequency: twice monthly 1 - Duration: years 1 - Last Use / Amount: 09/07/15 -  Substance  #2 Name of Substance 2: cocaine - powder 2 - Age of First Use: 28 2 - Frequency: pt states 09/07/15 was only second time he's ever used it 2 - Last Use / Amount: 09/07/15 - unknown amount  CIWA: CIWA-Ar BP: 113/66 mmHg Pulse Rate: 79 COWS:    PATIENT STRENGTHS: (choose at least two) Ability for insight Average or above average intelligence Capable of independent living Communication skills General fund of knowledge Motivation for treatment/growth Physical Health Work skills  Allergies: No Known Allergies  Home Medications:  (Not in a hospital admission)  OB/GYN Status:  No LMP for male patient.  General Assessment Data [facility] Assessment: In system Is this a Tele or Face-to-Face Assessment?: Tele Assessment Is this an Initial Assessment or a Re-assessment for this encounter?: Initial Assessment Marital status: Single Is patient pregnant?: No Pregnancy Status: No Living Arrangements: Alone Can pt return to current living arrangement?: Yes Admission Status: Involuntary Is patient capable of signing voluntary admission?: Yes Referral Source: Self/Family/Friend Insurance type: self pay     Crisis Care Plan Living Arrangements: Alone Name of Psychiatrist: none Name of Therapist: none  Education Status Is patient currently in school?: No Highest grade of school patient has completed: 10 (also got GED)  Risk to self with the past 6 months Suicidal Ideation: No Has patient  been a risk to self within the past 6 months prior to admission? : No Suicidal Intent: No Has patient had any suicidal intent within the past 6 months prior to admission? : No Is patient at risk for suicide?: No Suicidal Plan?: No Has patient had any suicidal plan within the past 6 months prior to admission? : No Access to Means: No What has been your use of drugs/alcohol within the last 12 months?: twice monthly Previous Attempts/Gestures: Yes How many times?: 1 (9  years ago) Other Self Harm Risks: none Triggers for Past Attempts: Unpredictable Intentional Self Injurious Behavior: None Family Suicide History: No Recent stressful life event(s): Job Loss Persecutory voices/beliefs?: No Depression: No Depression Symptoms:  (pt denies depressive symptoms) Substance abuse history and/or treatment for substance abuse?: No Suicide prevention information given to non-admitted patients: Not applicable  Risk to Others within the past 6 months Homicidal Ideation: No Does patient have any lifetime risk of violence toward others beyond the six months prior to admission? : No Thoughts of Harm to Others: No Current Homicidal Intent: No Current Homicidal Plan: No Access to Homicidal Means: No Identified Victim: none History of harm to others?: No Assessment of Violence: None Noted Violent Behavior Description: none Does patient have access to weapons?: No Criminal Charges Pending?: No Does patient have a court date: No Is patient on probation?: No  Psychosis Hallucinations: None noted Delusions: None noted  Mental Status Report Appearance/Hygiene: Unremarkable, In scrubs Eye Contact: Good Motor Activity: Freedom of movement, Unremarkable Speech: Logical/coherent Level of Consciousness: Alert Mood: Euthymic Affect: Appropriate to circumstance Anxiety Level: None Thought Processes: Relevant, Coherent Judgement: Unimpaired Orientation: Person, Place, Time, Situation Obsessive Compulsive Thoughts/Behaviors: None  Cognitive Functioning Concentration: Normal Memory: Recent Intact, Remote Intact IQ: Average Insight: Good Impulse Control: Fair Appetite: Good Weight Loss:  (pt trying to lose weight on purpose) Sleep: No Change Total Hours of Sleep: 7 Vegetative Symptoms: None  ADLScreening [facility] Assessment Services) Patient's cognitive ability adequate to safely complete daily activities?: Yes Patient able to express need for assistance with  ADLs?: Yes Independently performs ADLs?: Yes (appropriate for developmental age)  Prior Inpatient Therapy Prior Inpatient Therapy: Yes Prior Therapy Dates: Aug 2016 Prior Therapy Facilty/Provider(s): [facility] Reason for Treatment: SI, depression  Prior Outpatient Therapy Prior Outpatient Therapy: Yes Reason for Treatment: med management Does patient have an ACCT team?: No Does patient have Intensive In-House Services?  : No Does patient have [facility] services? : No Does patient have P4CC services?: No  ADL Screening (condition at time of admission) Patient's cognitive ability adequate to safely complete daily activities?: Yes Is the patient deaf or have difficulty hearing?: No Does the patient have difficulty seeing, even when wearing glasses/contacts?: No Does the patient have difficulty concentrating, remembering, or making decisions?: No Patient able to express need for assistance with ADLs?: Yes Does the patient have difficulty dressing or bathing?: No Independently performs ADLs?: Yes (appropriate for developmental age) Does the patient have difficulty walking or climbing stairs?: No Weakness of Legs: None Weakness of Arms/Hands: None       Abuse/Neglect Assessment (Assessment to be complete while patient is alone) Physical Abuse: Denies Verbal Abuse: Denies Sexual Abuse: Denies Exploitation of patient/patient's resources: Denies Self-Neglect: Denies     Merchant navy officer (For Healthcare) Does patient have an advance directive?: No Would patient like information on creating an advanced directive?: No - patient declined information    Additional Information 1:1 In Past 12 Months?: No CIRT Risk: No Elopement Risk:  No Does patient have medical clearance?: Yes     Disposition:  Disposition Initial Assessment Completed for this Encounter: Yes Disposition of Patient: Outpatient treatment Type of outpatient treatment: Adult (laura davis NP recommends outpatient)    Writer spoke w/ Fransisca Kaufmann NP who agrees with writer that pt doesn't meet inpatient treatment criteria. She recommends pt's IVC be rescinded and pt be d/c home. Writer spoke w/ EDP Beaton who is in agreement w/ disposition recommendation. Radford Pax will rescind IVC. Writer faxed to pt info including [place] Surveyor, minerals, Pharmacist, hospital from [employer] and Lexicographer for [employer].  Djuna Frechette P 09/09/2015 4:29 PM

## 2015-09-09 NOTE — ED Notes (Signed)
Patient in shower 

## 2015-09-09 NOTE — ED Notes (Signed)
Patient given a snack 

## 2015-09-09 NOTE — ED Notes (Signed)
Sitter at bedside.

## 2015-09-09 NOTE — ED Notes (Signed)
Patient's inventory sheet completed and valuable with security.

## 2015-09-09 NOTE — ED Provider Notes (Signed)
CSN: [id number]     Arrival date & time 09/08/15  2323 History   First MD Initiated Contact with Patient 09/09/15 [phone]     Chief Complaint  Patient presents with  . Suicidal     (Consider location/radiation/quality/duration/timing/severity/associated sxs/prior Treatment) HPI Comments: 30 year old male with a history of hypertension and bipolar 1 disorder presents to the emergency department for psychiatric evaluation. He initially presented voluntarily, but with IVC over threaded the patient leaving. He states that he came today because he was having worsening depression and suicidal thoughts. He states that his "crazy thoughts" were secondary to realizing that he was being laid off from his job. He states that this increased stress triggered the worsening of his depression. He was previously on Zoloft, but discontinued this medication 10 months ago. He has not seen a psychiatrist or therapist in "a minute". He states that he has no history of suicide attempt. He denies any suicidal thoughts at present. No homicidal ideations. No alcohol or illicit drug use recently.  The history is provided by the patient. No language interpreter was used.    Past Medical History  Diagnosis Date  . Bipolar 1 disorder (HCC)   . Hypertension    Past Surgical History  Procedure Laterality Date  . Appendectomy    . Eye surgery    . Abdominal surgery     No family history on file. Social History  Substance Use Topics  . Smoking status: Current Every Day Smoker -- 2.00 packs/day    Types: Cigarettes  . Smokeless tobacco: None  . Alcohol Use: Yes     Comment: pt reports 1-2 beers every other day    Review of Systems  Psychiatric/Behavioral: Positive for suicidal ideas and behavioral problems.  All other systems reviewed and are negative.   Allergies  Review of patient's allergies indicates no known allergies.  Home Medications   Prior to Admission medications   Medication Sig Start Date End  Date Taking? Authorizing Provider  amLODipine (NORVASC) 5 MG tablet Take 1 tablet (5 mg total) by mouth daily. For high blood pressure 10/24/14   Sanjuana KavaAgnes I Nwoko, NP  FLUoxetine (PROZAC) 20 MG capsule Take 1 capsule (20 mg total) by mouth daily. For depression 10/24/14   Sanjuana KavaAgnes I Nwoko, NP  hydrochlorothiazide (HYDRODIURIL) 25 MG tablet Take 1 tablet (25 mg total) by mouth daily. For high blood pressure 10/24/14   Sanjuana KavaAgnes I Nwoko, NP  nicotine (NICODERM CQ - DOSED IN MG/24 HOURS) 21 mg/24hr patch Place 1 patch (21 mg total) onto the skin daily. Nicotine dependence 10/25/14   Sanjuana KavaAgnes I Nwoko, NP  traZODone (DESYREL) 100 MG tablet Take 1 tablet (100 mg total) by mouth at bedtime as needed for sleep. 10/24/14   Sanjuana KavaAgnes I Nwoko, NP   BP 113/66 mmHg  Pulse 79  Temp(Src) 98.4 F (36.9 C) (Oral)  Resp 18  SpO2 100%   Physical Exam  Constitutional: He is oriented to person, place, and time. He appears well-developed and well-nourished. No distress.  HENT:  Head: Normocephalic and atraumatic.  Eyes: Conjunctivae and EOM are normal. No scleral icterus.  Neck: Normal range of motion.  Cardiovascular: Normal rate, regular rhythm and intact distal pulses.   Pulmonary/Chest: Effort normal. No respiratory distress. He has no wheezes.  Musculoskeletal: Normal range of motion.  Neurological: He is alert and oriented to person, place, and time. He exhibits normal muscle tone. Coordination normal.  Skin: Skin is warm and dry. No rash noted. He is not diaphoretic.  No erythema. No pallor.  Psychiatric: His speech is normal and behavior is normal. Cognition and memory are normal. He exhibits a depressed mood (mild). He expresses no homicidal ideation. He expresses no suicidal plans and no homicidal plans.  No SI/HI currently  Nursing note and vitals reviewed.   ED Course  Procedures (including critical care time) Labs Review Labs Reviewed  COMPREHENSIVE METABOLIC PANEL - Abnormal; Notable for the following:     Glucose, Bld 126 (*)    Creatinine, Ser 1.25 (*)    Calcium 8.8 (*)    All other components within normal limits  ACETAMINOPHEN LEVEL - Abnormal; Notable for the following:    Acetaminophen (Tylenol), Serum <10 (*)    All other components within normal limits  URINE RAPID DRUG SCREEN, HOSP PERFORMED - Abnormal; Notable for the following:    Cocaine POSITIVE (*)    Tetrahydrocannabinol POSITIVE (*)    All other components within normal limits  ETHANOL  SALICYLATE LEVEL  CBC    Imaging Review No results found. I have personally reviewed and evaluated these images and lab results as part of my medical decision-making.   EKG Interpretation None      MDM   Final diagnoses:  Adjustment disorder with depressed mood    30 year old male presents to the emergency department for evaluation of worsening depression and suicidal ideation. He has been medically cleared. IVC papers taken out by Dr. Blinda Leatherwood over concern for the patient eloping from the department. TTS evaluation and recommendations are pending. Disposition to be determined by oncoming ED provider.   Filed Vitals:   09/08/15 2337  BP: 113/66  Pulse: 79  Temp: 98.4 F (36.9 C)  Resp: [address] PA-C 09/09/15 1610  Tomasita Crumble, MD 09/09/15 [phone]

## 2015-09-09 NOTE — ED Notes (Signed)
Ordered diet tray 

## 2015-09-13 ENCOUNTER — Emergency Department (HOSPITAL_COMMUNITY)
Admission: EM | Admit: 2015-09-13 | Discharge: 2015-09-13 | Disposition: A | Discharge status: Home / Self Care | Payer: Self-pay | Attending: Emergency Medicine | Admitting: Emergency Medicine

## 2015-09-13 ENCOUNTER — Observation Stay (HOSPITAL_COMMUNITY)
Admission: AD | Admit: 2015-09-13 | Discharge: 2015-09-13 | Disposition: A | Discharge status: Home / Self Care | Payer: Federal, State, Local not specified - Other | Source: Intra-hospital | Attending: Psychiatry | Admitting: Psychiatry

## 2015-09-13 ENCOUNTER — Encounter (HOSPITAL_COMMUNITY): Payer: Self-pay | Admitting: Emergency Medicine

## 2015-09-13 DIAGNOSIS — F17210 Nicotine dependence, cigarettes, uncomplicated: Secondary | ICD-10-CM | POA: Insufficient documentation

## 2015-09-13 DIAGNOSIS — I10 Essential (primary) hypertension: Secondary | ICD-10-CM | POA: Insufficient documentation

## 2015-09-13 DIAGNOSIS — R45851 Suicidal ideations: Secondary | ICD-10-CM | POA: Insufficient documentation

## 2015-09-13 DIAGNOSIS — F329 Major depressive disorder, single episode, unspecified: Secondary | ICD-10-CM | POA: Insufficient documentation

## 2015-09-13 DIAGNOSIS — F32A Depression, unspecified: Secondary | ICD-10-CM

## 2015-09-13 LAB — COMPREHENSIVE METABOLIC PANEL
ALBUMIN: 3.8 g/dL (ref 3.5–5.0)
ALK PHOS: 74 U/L (ref 38–126)
ALT: 22 U/L (ref 17–63)
ANION GAP: 8 (ref 5–15)
AST: 25 U/L (ref 15–41)
BILIRUBIN TOTAL: 0.5 mg/dL (ref 0.3–1.2)
BUN: 7 mg/dL (ref 6–20)
CALCIUM: 8.9 mg/dL (ref 8.9–10.3)
CO2: 26 mmol/L (ref 22–32)
Chloride: 106 mmol/L (ref 101–111)
Creatinine, Ser: 1.11 mg/dL (ref 0.61–1.24)
GFR calc Af Amer: >60 mL/min (ref >60)
GLUCOSE: 135 mg/dL — AB (ref 65–99)
POTASSIUM: 4.1 mmol/L (ref 3.5–5.1)
Sodium: 140 mmol/L (ref 135–145)
TOTAL PROTEIN: 6.3 g/dL — AB (ref 6.5–8.1)

## 2015-09-13 LAB — CBC WITH DIFFERENTIAL/PLATELET
BASOS ABS: 0 10*3/uL (ref 0.0–0.1)
BASOS PCT: 0 %
EOS ABS: 0.3 10*3/uL (ref 0.0–0.7)
Eosinophils Relative: 4 %
HCT: 43.5 % (ref 39.0–52.0)
Hemoglobin: 13.7 g/dL (ref 13.0–17.0)
Lymphocytes Relative: 43 %
Lymphs Abs: 3.8 10*3/uL (ref 0.7–4.0)
MCH: 29.3 pg (ref 26.0–34.0)
MCHC: 31.5 g/dL (ref 30.0–36.0)
MCV: 92.9 fL (ref 78.0–100.0)
MONO ABS: 0.6 10*3/uL (ref 0.1–1.0)
MONOS PCT: 6 %
NEUTROS PCT: 47 %
Neutro Abs: 4.1 10*3/uL (ref 1.7–7.7)
Platelets: 242 10*3/uL (ref 150–400)
RBC: 4.68 MIL/uL (ref 4.22–5.81)
RDW: 12.9 % (ref 11.5–15.5)
WBC: 8.8 10*3/uL (ref 4.0–10.5)

## 2015-09-13 LAB — RAPID URINE DRUG SCREEN, HOSP PERFORMED
Amphetamines: NOT DETECTED
BENZODIAZEPINES: NOT DETECTED
Barbiturates: NOT DETECTED
COCAINE: POSITIVE — AB
OPIATES: NOT DETECTED
Tetrahydrocannabinol: POSITIVE — AB

## 2015-09-13 LAB — ETHANOL

## 2015-09-13 NOTE — ED Provider Notes (Signed)
CSN: [id number]     Arrival date & time 09/13/15  0236 History   First MD Initiated Contact with Patient 09/13/15 [phone]     Chief Complaint  Patient presents with  . Psychiatric Evaluation     (Consider location/radiation/quality/duration/timing/severity/associated sxs/prior Treatment) HPI Comments: Patient with a history of HTN, bipolar presents to the emergency room with "having crazy thoughts". He states he was seen and evaluated by psychiatry this past week and was told to return if he had recurrent suicidal thoughts. He describes the "crazy thoughts" as fleeting SI, no auditory or visual hallucinations. No self harm prior to arrival. No homicidal thoughts.   The history is provided by the patient. No language interpreter was used.    Past Medical History  Diagnosis Date  . Bipolar 1 disorder (HCC)   . Hypertension    Past Surgical History  Procedure Laterality Date  . Appendectomy    . Eye surgery    . Abdominal surgery     No family history on file. Social History  Substance Use Topics  . Smoking status: Current Every Day Smoker -- 2.00 packs/day    Types: Cigarettes  . Smokeless tobacco: None  . Alcohol Use: Yes     Comment: pt reports 1-2 beers every other day    Review of Systems  Constitutional: Negative for fever and chills.  HENT: Negative.   Respiratory: Negative.   Cardiovascular: Negative.   Gastrointestinal: Negative.   Musculoskeletal: Negative.   Skin: Negative.   Neurological: Negative.   Psychiatric/Behavioral: Positive for suicidal ideas and dysphoric mood. Negative for agitation.      Allergies  Review of patient's allergies indicates no known allergies.  Home Medications   Prior to Admission medications   Medication Sig Start Date End Date Taking? Authorizing Provider  amLODipine (NORVASC) 5 MG tablet Take 1 tablet (5 mg total) by mouth daily. For high blood pressure Patient not taking: Reported on 09/13/2015 10/24/14   Sanjuana KavaAgnes I Nwoko, NP   FLUoxetine (PROZAC) 20 MG capsule Take 1 capsule (20 mg total) by mouth daily. For depression Patient not taking: Reported on 09/13/2015 10/24/14   Sanjuana KavaAgnes I Nwoko, NP  hydrochlorothiazide (HYDRODIURIL) 25 MG tablet Take 1 tablet (25 mg total) by mouth daily. For high blood pressure Patient not taking: Reported on 09/13/2015 10/24/14   Sanjuana KavaAgnes I Nwoko, NP  nicotine (NICODERM CQ - DOSED IN MG/24 HOURS) 21 mg/24hr patch Place 1 patch (21 mg total) onto the skin daily. Nicotine dependence Patient not taking: Reported on 09/13/2015 10/25/14   Sanjuana KavaAgnes I Nwoko, NP  traZODone (DESYREL) 100 MG tablet Take 1 tablet (100 mg total) by mouth at bedtime as needed for sleep. Patient not taking: Reported on 09/13/2015 10/24/14   Sanjuana KavaAgnes I Nwoko, NP   BP 113/72 mmHg  Pulse 68  Temp(Src) 97.8 F (36.6 C) (Oral)  Resp 18  Ht 6' (1.829 m)  Wt 99.791 kg  BMI 29.83 kg/m2  SpO2 99% Physical Exam  Constitutional: He is oriented to person, place, and time. He appears well-developed and well-nourished.  HENT:  Head: Normocephalic.  Neck: Normal range of motion. Neck supple.  Cardiovascular: Normal rate and regular rhythm.   Pulmonary/Chest: Effort normal and breath sounds normal.  Abdominal: Soft. Bowel sounds are normal. There is no tenderness. There is no rebound and no guarding.  Musculoskeletal: Normal range of motion.  Neurological: He is alert and oriented to person, place, and time.  Skin: Skin is warm and dry. No rash noted.  Psychiatric: He has a normal mood and affect. His speech is normal and behavior is normal. He expresses suicidal ideation. He expresses no suicidal plans.    ED Course  Procedures (including critical care time) Labs Review Labs Reviewed  CBC WITH DIFFERENTIAL/PLATELET  COMPREHENSIVE METABOLIC PANEL  ETHANOL  URINE RAPID DRUG SCREEN, HOSP PERFORMED   Results for orders placed or performed during the hospital encounter of 09/13/15  Comprehensive metabolic panel  Result Value Ref Range    Sodium 140 135 - 145 mmol/L   Potassium 4.1 3.5 - 5.1 mmol/L   Chloride 106 101 - 111 mmol/L   CO2 26 22 - 32 mmol/L   Glucose, Bld 135 (H) 65 - 99 mg/dL   BUN 7 6 - 20 mg/dL   Creatinine, Ser 0.45 0.61 - 1.24 mg/dL   Calcium 8.9 8.9 - 40.9 mg/dL   Total Protein 6.3 (L) 6.5 - 8.1 g/dL   Albumin 3.8 3.5 - 5.0 g/dL   AST 25 15 - 41 U/L   ALT 22 17 - 63 U/L   Alkaline Phosphatase 74 38 - 126 U/L   Total Bilirubin 0.5 0.3 - 1.2 mg/dL   GFR calc non Af Amer >60 >60 mL/min   GFR calc Af Amer >60 >60 mL/min   Anion gap 8 5 - 15  Ethanol  Result Value Ref Range   Alcohol, Ethyl (B) <5 <5 mg/dL  CBC with Diff  Result Value Ref Range   WBC 8.8 4.0 - 10.5 K/uL   RBC 4.68 4.22 - 5.81 MIL/uL   Hemoglobin 13.7 13.0 - 17.0 g/dL   HCT 81.1 91.4 - 78.2 %   MCV 92.9 78.0 - 100.0 fL   MCH 29.3 26.0 - 34.0 pg   MCHC 31.5 30.0 - 36.0 g/dL   RDW 95.6 21.3 - 08.6 %   Platelets 242 150 - 400 K/uL   Neutrophils Relative % 47 %   Neutro Abs 4.1 1.7 - 7.7 K/uL   Lymphocytes Relative 43 %   Lymphs Abs 3.8 0.7 - 4.0 K/uL   Monocytes Relative 6 %   Monocytes Absolute 0.6 0.1 - 1.0 K/uL   Eosinophils Relative 4 %   Eosinophils Absolute 0.3 0.0 - 0.7 K/uL   Basophils Relative 0 %   Basophils Absolute 0.0 0.0 - 0.1 K/uL    Imaging Review No results found. I have personally reviewed and evaluated these images and lab results as part of my medical decision-making.   EKG Interpretation None      MDM   Final diagnoses:  None    1. Suicidal ideations  Patient returns as instructed for recurrent suicidal thoughts, without plan, fleeting. TTS consult will be requested to determine disposition.     Elpidio Anis, PA-C 09/13/15 [id number]  Zadie Rhine, MD 09/13/15 [phone]

## 2015-09-13 NOTE — ED Notes (Addendum)
Pt given Pod C policy sheet - voiced understanding of policies. Aware breakfast has been ordered and aware of need for urine specimen.

## 2015-09-13 NOTE — ED Notes (Signed)
Per Idalia NeedlePaige, [facility] pt [facility] to Obs Bed 1 - Dr Lucianne MussKumar.

## 2015-09-13 NOTE — ED Notes (Signed)
Pt. requesting psychaitric evaluation  , states " I have some crazy thoughts " , denies suicidal or homicidal ideations , no hallucinations .

## 2015-09-13 NOTE — ED Notes (Signed)
[facility]Paige, [facility], spoke w/pt via phone.

## 2015-09-13 NOTE — ED Notes (Addendum)
Pt resting quietly on bed w/eyes closed. Respirations even, unlabored. Woke pt and advised breakfast tray on bedside table. Pt opened eyes, stated "thank you" and then returned to sleeping.

## 2015-09-13 NOTE — ED Notes (Addendum)
Idalia NeedlePaige, [facility], and Dr Rosalia Hammersay aware pt requesting to be d/c'd - states he does not feel like he needs inpt tx. Paige to discuss w/Laura, NP, and will advise. Pt aware of delay.

## 2015-09-13 NOTE — ED Notes (Signed)
Pt noted to be sleeping on bed.

## 2015-09-13 NOTE — ED Notes (Signed)
Pt is aware urine is needed, pt unable to urinate at this time. Urinal at bedside.  

## 2015-09-13 NOTE — ED Notes (Signed)
Pt ambulatory to C21 w/sitter. Belongings bag x 1 placed at nurses' desk for inventory. Pt dressed in maroon-colored paper scrubs.

## 2015-09-13 NOTE — ED Notes (Signed)
Pt resting on bed w/eyes closed. Respirations even, unlabored. Pt woke to voice - asked pt about his right eye d/t appears grayish in color. Pt noted to be irritable - stating his eye is OK and asking "what does my eye have to do with anything?" States this is not a new condition. Advised pt staff attempting to ensure this is not a new problem that needs to be assessed.  Denies blurred vision and refusing for eye to be examined by EDP.    After waiting a few minutes to decide, Pt in agreement w/tx [facility]. Voices understanding of need for urine specimen. Pt asking for breakfast - advised pt RN has called x 2.

## 2015-09-13 NOTE — ED Notes (Signed)
Pt woken by RN so may [facility]. Pt yelling at RN, stating he wants to read over the paperwork for 10 min while he thinks. Pt noted to be very irritable. Paperwork left at bedside for pt.

## 2015-09-13 NOTE — ED Provider Notes (Signed)
30 year old male history of bipolar affective disorder presented with evaluation for "crazy thoughts". Patient states the same to me. He states he does not wish to discuss them any further. He specifically denies suicidal ideation, homicidal ideation, or hearing or seeing anything or anyone that is not there. Patient was seen and evaluated by behavioral health. He was offered hospitalization but declined. He does not meet any IVC criteria. He has no medical complaints at this time. He requests discharge.  Margarita Grizzleanielle Isaac Lacson, MD 09/13/15 [phone]

## 2015-09-13 NOTE — BH Assessment (Signed)
Tele Assessment Note  PT'S UDS NOT COMPLETED AT TIME OF TELEASSESSMENT  Caleb Griffin is an 30 y.o. male. Pt presents voluntarily to [facility] with "crazy thoughts" about his job loss. Writer assessed pt on 09/09/15 when he presented to [facility] for "crazy thoughts". Pt's affect is quite different than it was 09/09/15. He is irritable and has fair eye contact. When asked about his "crazy thoughts", pt says, "Having trouble find a job." Pt then repeats this statement three more times in a row. Pt denies SI and HI. He doesn't answer writer's other questions. He appears to be ruminating about his job loss. Pt appears to be impaired in some way. He denies [facility]. At start of teleassessment. Pt makes writer turn down volume of teleassessment machine to almost no sound at all. Pt continued to say that the volume was too loud.   Writer's assessment from 09/09/15: Caleb Griffin is an 30 y.o. male. Pt presented voluntarily to [facility] but then was placed under IVC early this am d/t worry that pt might elope. Pt is pleasant and oriented x 4. He denies SI and denies plan. Pt denies HI and denies [facility]. No delusions noted. 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 He reports his mood has been good until yesterday when he was laid off. Pt is future oriented. He asks Clinical research associate for "help finding another job." Pt denies access to weapons and he has no upcoming court dates. Per chart review, pt was admitted to [facility] Aug 2016 for SI and depression. He reports one suicide attempt by stabbing himself approx 9 yrs ago. Pt reports he lives alone and has 4 daughters. He reports he smokes marijuana twice a month and has used cocaine twice ever. He says he last used cocaine & marijuana 09/07/15.   Diagnosis: Unspecified Depressive Disorder  Past Medical History:  Past Medical History  Diagnosis Date  . Bipolar 1 disorder (HCC)   . Hypertension     Past Surgical History  Procedure Laterality  Date  . Appendectomy    . Eye surgery    . Abdominal surgery      Family History: No family history on file.  Social History:  reports that he has been smoking Cigarettes.  He has been smoking about 2.00 packs per day. He does not have any smokeless tobacco history on file. He reports that he drinks alcohol. He reports that he uses illicit drugs (Marijuana).  Additional Social History:  Alcohol / Drug Use Pain Medications: pt denies abuse - see pta meds list Prescriptions: pt denies abuse - see pta meds list Over the Counter: pt denise abuse - see pta meds list History of alcohol / drug use?: Yes Substance #1 Name of Substance 1: marijuana 1 - Age of First Use: 12 1 - Amount (size/oz): 1 to 2 blunts 1 - Frequency: twice monthly 1 - Duration: years 1 - Last Use / Amount: unknown - UDS not completed yet Substance #2 Name of Substance 2: cocaine - powder 2 - Age of First Use: 28 2 - Frequency: pt sts 09/07/15 was only 2nd time he's ever used it 2 - Duration: years 2 - Last Use / Amount: unknown - UDS not completed  CIWA: CIWA-Ar BP: 113/72 mmHg Pulse Rate: 68 COWS:    PATIENT STRENGTHS: (choose at least two) Average or above average intelligence Capable of independent living Communication skills Physical Health Work skills  Allergies: No Known  Allergies  Home Medications:  (Not in a hospital admission)  OB/GYN Status:  No LMP for male patient.  General Assessment Data [facility] Assessment: In system Is this a Tele or Face-to-Face Assessment?: Tele Assessment Is this an Initial Assessment or a Re-assessment for this encounter?: Initial Assessment Marital status: Single Is patient pregnant?: No Pregnancy Status: No Living Arrangements: Alone Can pt return to current living arrangement?: Yes Admission Status: Voluntary Is patient capable of signing voluntary admission?: Yes Referral Source: Self/Family/Friend Insurance type: self pay      Crisis Care Plan Living Arrangements: Alone Name of Psychiatrist: none Name of Therapist: none  Education Status Is patient currently in school?: No Highest grade of school patient has completed: 10 (got his GED)  Risk to self with the past 6 months Suicidal Ideation: No Has patient been a risk to self within the past 6 months prior to admission? : No Suicidal Intent: No Has patient had any suicidal intent within the past 6 months prior to admission? : No Is patient at risk for suicide?: Yes Suicidal Plan?: No Has patient had any suicidal plan within the past 6 months prior to admission? : No Access to Means: No What has been your use of drugs/alcohol within the last 12 months?: twice monthly marijuana, coke twice ever Previous Attempts/Gestures: Yes How many times?: 1 (9 years ago stabbed himself in stomach) Other Self Harm Risks: none Triggers for Past Attempts: Unpredictable Intentional Self Injurious Behavior: None Family Suicide History: No Recent stressful life event(s): Job Loss Persecutory voices/beliefs?: No Depression: No Depression Symptoms:  (pt denies depressive symptoms) Substance abuse history and/or treatment for substance abuse?: No Suicide prevention information given to non-admitted patients: Not applicable  Risk to Others within the past 6 months Homicidal Ideation: No Does patient have any lifetime risk of violence toward others beyond the six months prior to admission? : No Thoughts of Harm to Others: No Current Homicidal Intent: No Current Homicidal Plan: No Access to Homicidal Means: No Identified Victim: none History of harm to others?: No Assessment of Violence: None Noted Violent Behavior Description: pt denies hx violence Does patient have access to weapons?: No Criminal Charges Pending?: No Does patient have a court date: No Is patient on probation?: No  Psychosis Hallucinations: None noted Delusions: None noted  Mental Status  Report Appearance/Hygiene: Unremarkable, In scrubs Eye Contact: Fair Motor Activity: Freedom of movement Speech: Logical/coherent Level of Consciousness: Alert, Irritable, Quiet/awake Mood: Euthymic (until job loss last week) Affect: Appropriate to circumstance Anxiety Level: None Thought Processes: Coherent, Relevant Judgement: Impaired (UDS not completed at time of assessment) Orientation: Person, Place, Time, Situation Obsessive Compulsive Thoughts/Behaviors: None  Cognitive Functioning Concentration: Normal Memory: Recent Intact, Remote Intact IQ: Average Insight: Fair Impulse Control: Fair Appetite:  (pt reports trying to lose weight on purpose) Sleep: No Change Total Hours of Sleep: 7 Vegetative Symptoms: None  ADLScreening [facility] Assessment Services) Patient's cognitive ability adequate to safely complete daily activities?: Yes Patient able to express need for assistance with ADLs?: Yes Independently performs ADLs?: Yes (appropriate for developmental age)  Prior Inpatient Therapy Prior Inpatient Therapy: Yes Prior Therapy Dates: Aug 2016 [facility] Reason for Treatment: SI, depression  Prior Outpatient Therapy Prior Outpatient Therapy: No Prior Therapy Facilty/Provider(s): n/a Reason for Treatment: n/a Does patient have an ACCT team?: No Does patient have Intensive In-House Services?  : No Does patient have [facility] services? : Unknown Does patient have P4CC services?: Unknown  ADL Screening (condition at time of admission) Patient's cognitive ability adequate to safely complete daily activities?: Yes Is the patient deaf or have difficulty hearing?: No Does the patient have difficulty seeing, even when wearing glasses/contacts?: No Does the patient have difficulty concentrating, remembering, or making decisions?: No Patient able to express need for assistance with ADLs?: Yes Does the patient have difficulty dressing or bathing?:  No Independently performs ADLs?: Yes (appropriate for developmental age) Does the patient have difficulty walking or climbing stairs?: No Weakness of Legs: None Weakness of Arms/Hands: None  Home Assistive Devices/Equipment Home Assistive Devices/Equipment: None    Abuse/Neglect Assessment (Assessment to be complete while patient is alone) Physical Abuse: Denies Verbal Abuse: Denies Sexual Abuse: Denies Exploitation of patient/patient's resources: Denies Self-Neglect: Denies     Merchant navy officer (For Healthcare) Does patient have an advance directive?: No Would patient like information on creating an advanced directive?: No - patient declined information    Additional Information 1:1 In Past 12 Months?: No CIRT Risk: No Elopement Risk: No Does patient have medical clearance?: No (UDS not back yet)     Disposition:  Disposition Initial Assessment Completed for this Encounter: Yes  Hakan Nudelman P 09/13/2015 8:20 AM

## 2015-09-13 NOTE — [facility] Counselor (Signed)
[facility] and can be transferred at any time. Writer notified pt's RN.  Caleb Griffin Polina Burmaster, [place] Therapeutic Triage Specialist

## 2015-09-13 NOTE — Discharge Instructions (Signed)
Please follow up as instructed by behavioral health.  °

## 2015-09-13 NOTE — ED Notes (Signed)
Dr Rosalia Hammersay speaking w/pt.

## 2015-09-13 NOTE — ED Notes (Signed)
Woke pt so may obtain lunch order request - advised pt of need for urine specimen x 3 and encouraged to empty bladder. Voiced understanding then returned to sleeping.

## 2016-08-05 IMAGING — CR DG CHEST 2V
2 series · 2 of 2 positions shown · non-contrast
Comparison: None.

CLINICAL DATA: Upper chest pain for 24 hr, increasing all day.
Shortness of breath. Smoker and high blood pressure.

EXAM:
CHEST  2 VIEW

[w chest pa]
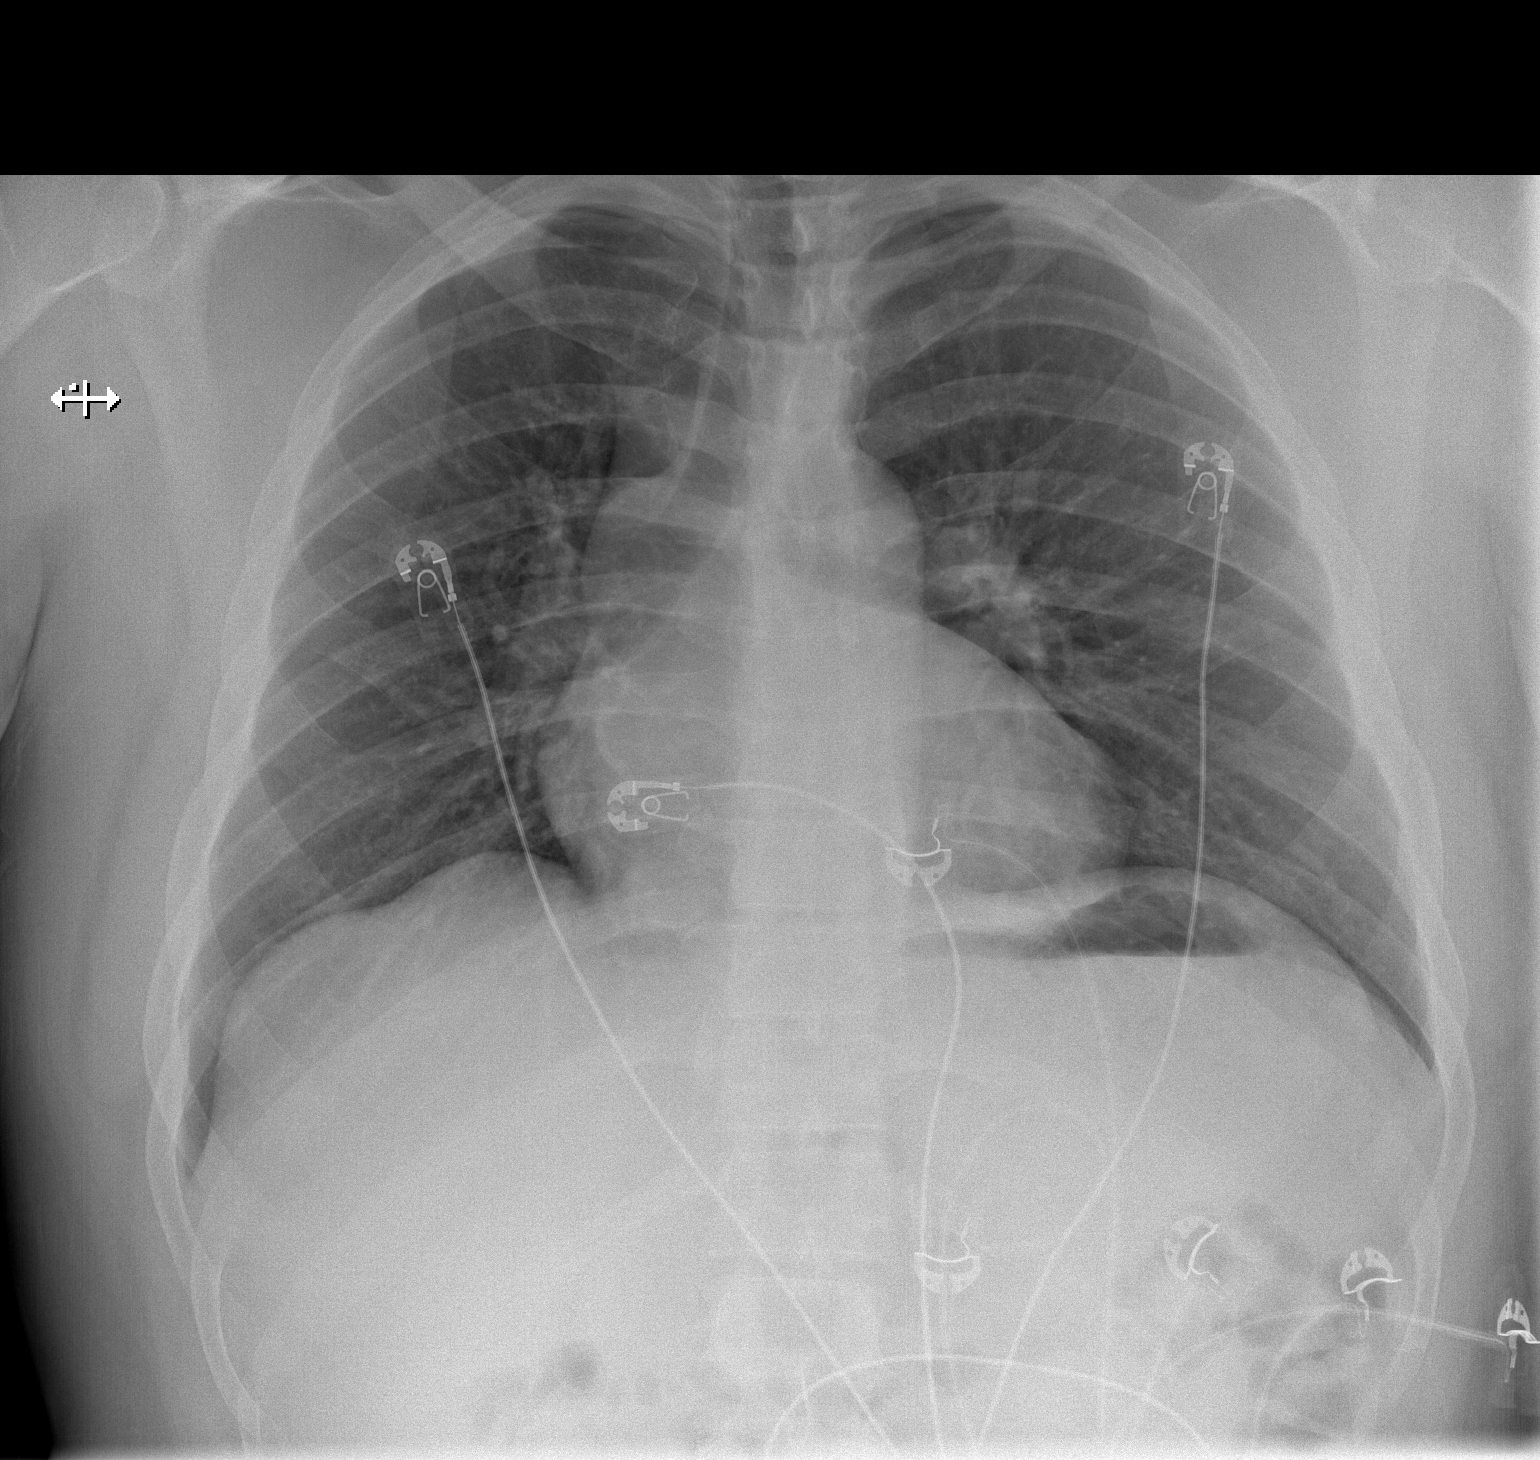

[w chest lat]
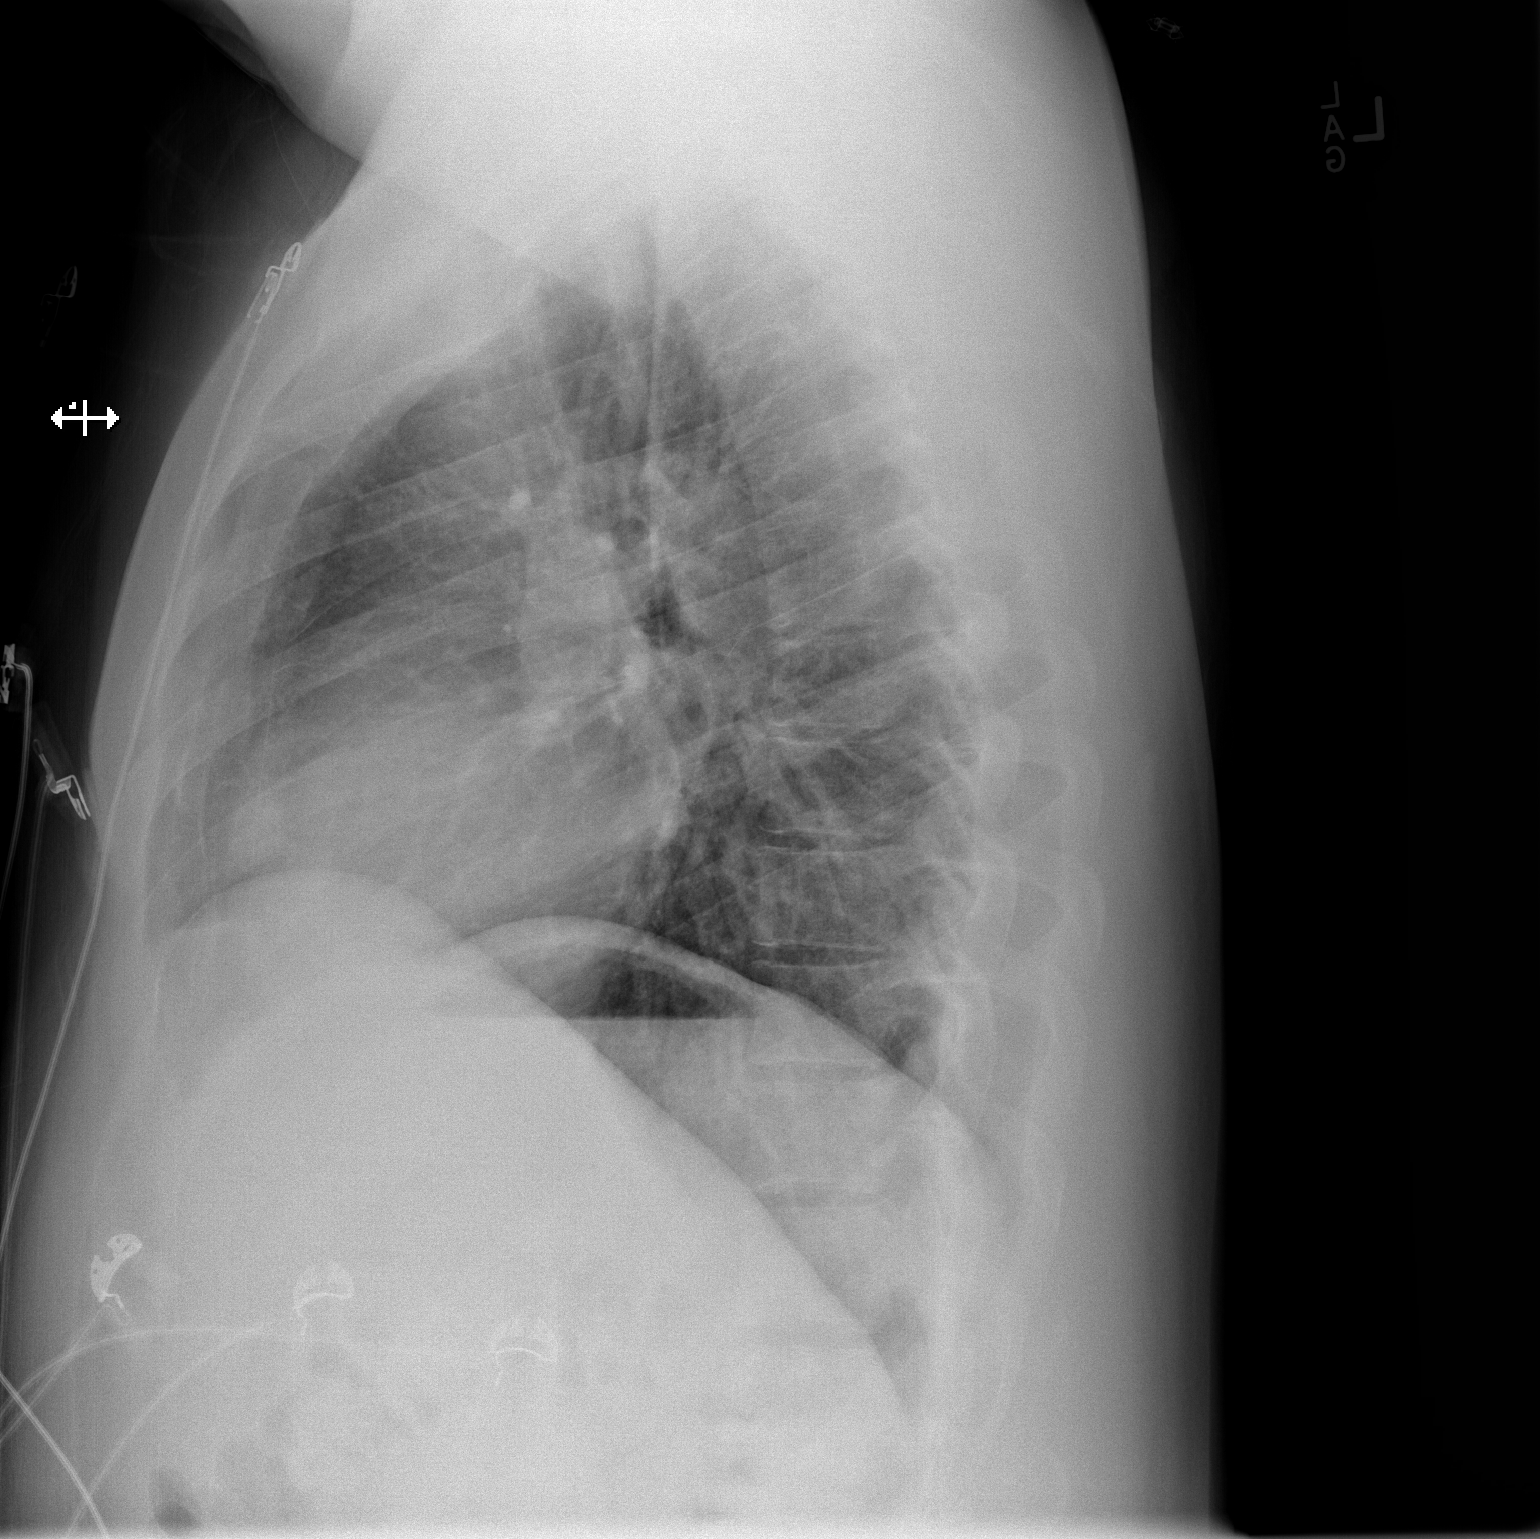

[2 of 2 positions shown; findings below may reference images not displayed]

FINDINGS: The heart size and mediastinal contours are within normal limits.
Both lungs are clear. The visualized skeletal structures are
unremarkable.
IMPRESSION: No active cardiopulmonary disease.

## 2016-08-06 IMAGING — CR DG CHEST 2V
2 series · 2 of 2 positions shown · non-contrast
Comparison: 10/21/2014

CLINICAL DATA: Sharp LEFT-sided chest pain beginning 2 days ago,
history smoking and hypertension

EXAM:
CHEST  2 VIEW

[w chest pa]
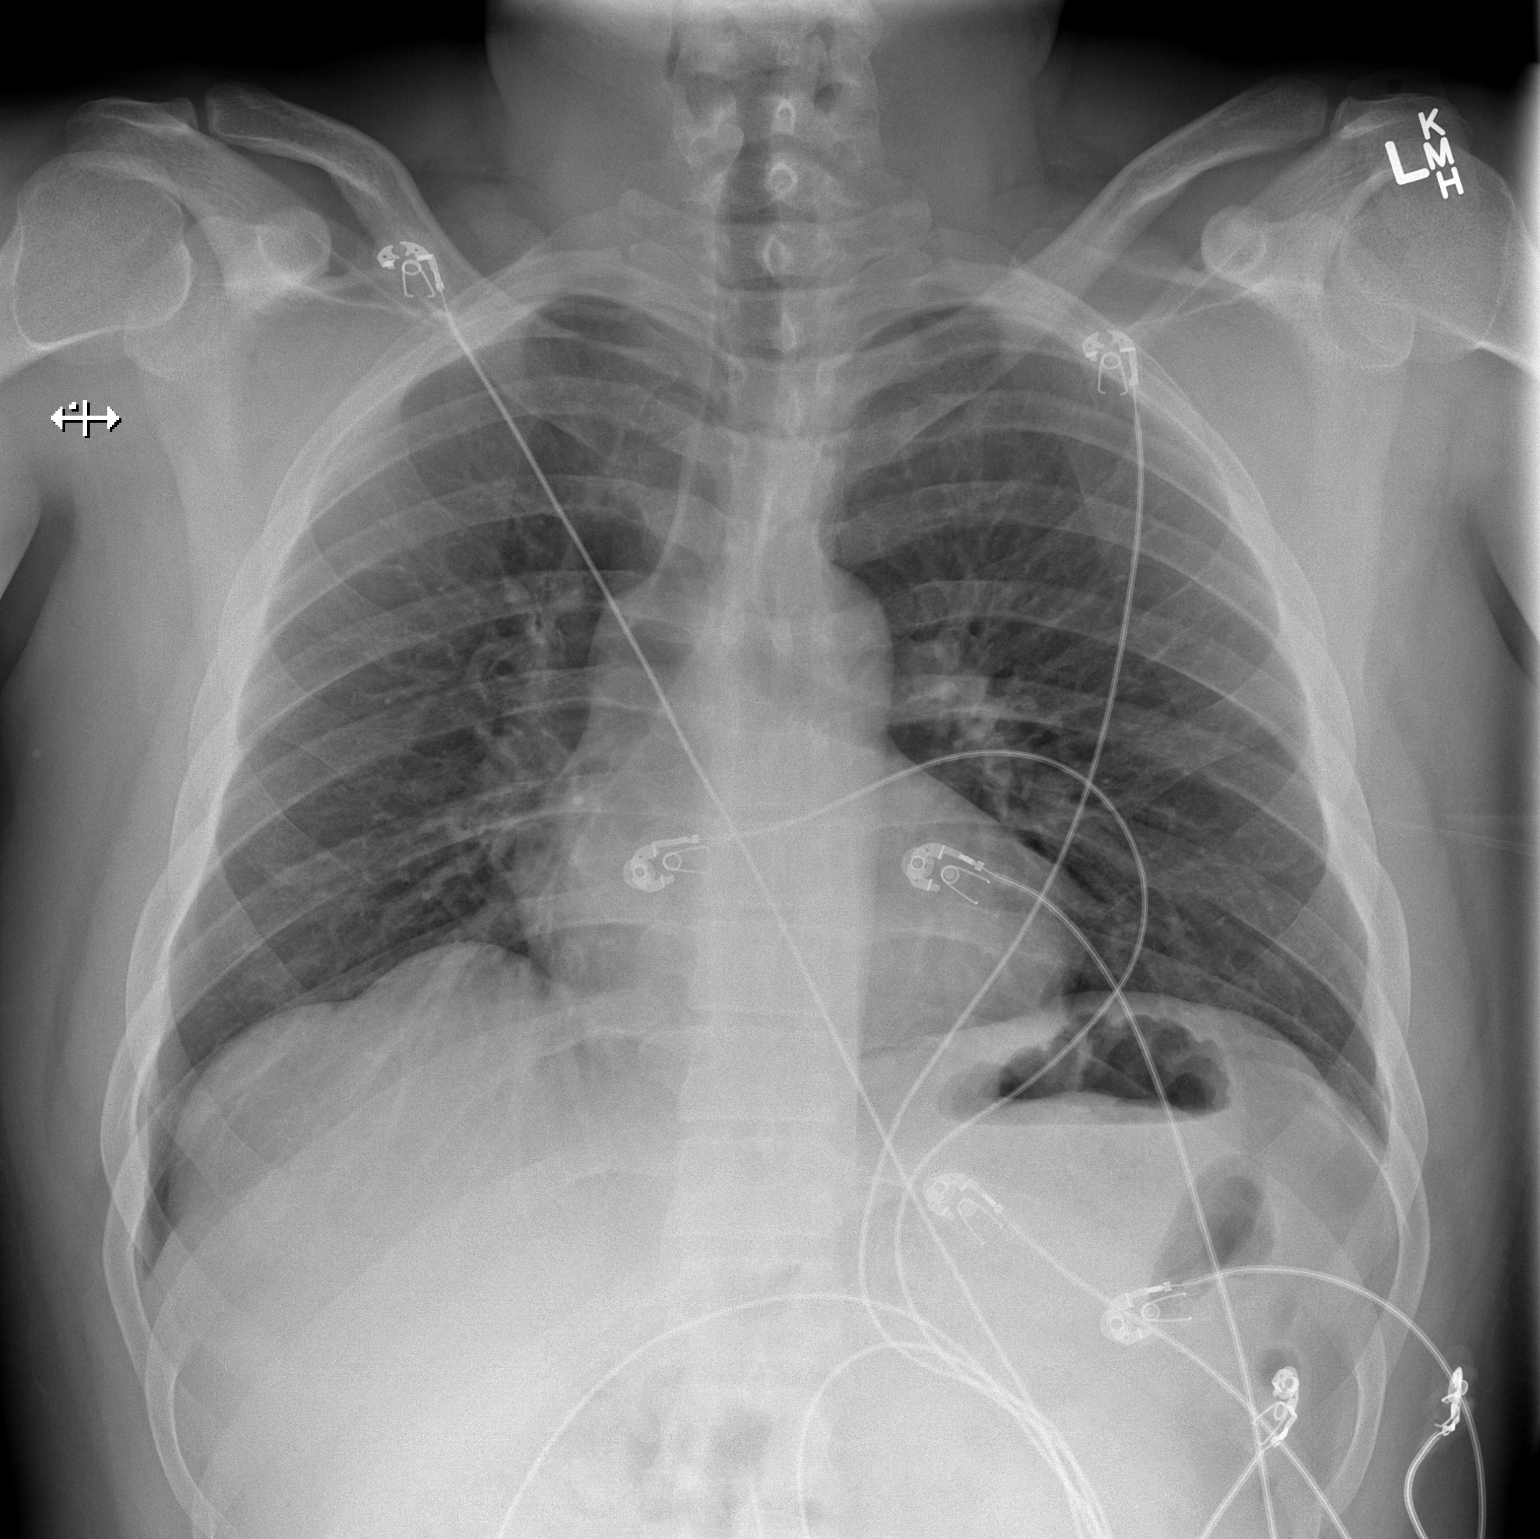

[w chest lat]
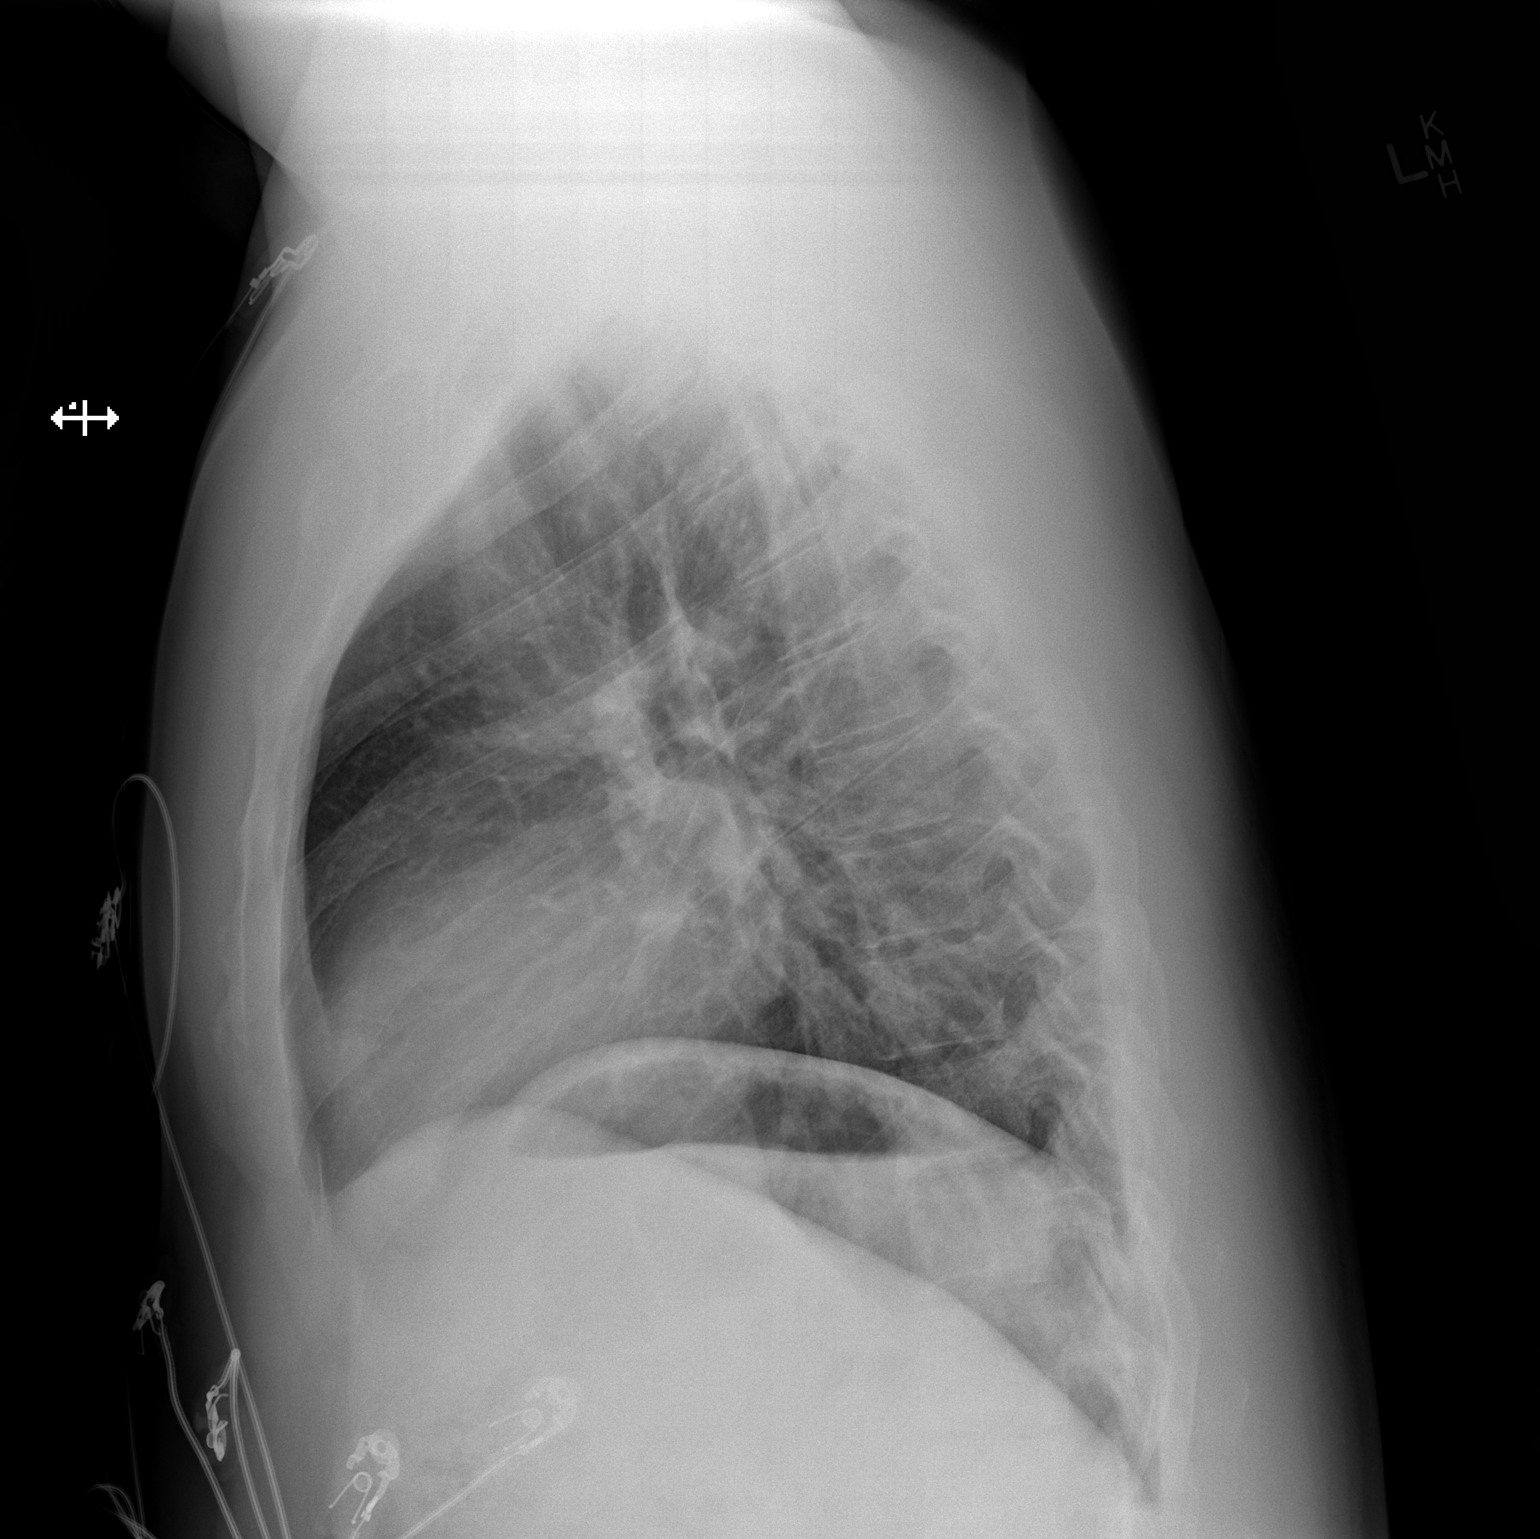

[2 of 2 positions shown; findings below may reference images not displayed]

FINDINGS: Normal heart size, mediastinal contours, and pulmonary vascularity.

Lungs clear.

No pneumothorax.

Bones unremarkable.
IMPRESSION: Normal exam.
